# Patient Record
Sex: Male | Born: 1990 | Race: White | Hispanic: No | Marital: Single | Smoking: Current every day smoker
Health system: Southern US, Community
[De-identification: ages and names within clinical notes are randomized; demographics above are authoritative.]

## PROBLEM LIST (undated history)

## (undated) DIAGNOSIS — K589 Irritable bowel syndrome without diarrhea: Secondary | ICD-10-CM

## (undated) HISTORY — PX: WRIST SURGERY: SHX841

---
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
## 2010-08-20 IMAGING — CT CT ABDOMEN W/ CM
1 of 2 series · 15 of 32 positions shown, 20 images · IV contrast (isovue)
Comparison: none

REASON FOR EXAM: splenomegaly  LUQ and LLQ abd pain
COMMENTS:

PROCEDURE:     CT  - CT ABDOMEN STANDARD W  - March 01, 2009  [DATE]
RESULT:
TECHNIQUE: Helical 5 mm sections were obtained from the lung bases through
the pubic symphysis status post intravenous administration of 100 ml Isovue
370 and oral contrast.

[Series 2: abdomen · axial · 0.83mm/px · z∈[-949,-644]mm · 15 of 67 slices shown, 20 images]
[im 3/67  soft-tissue]
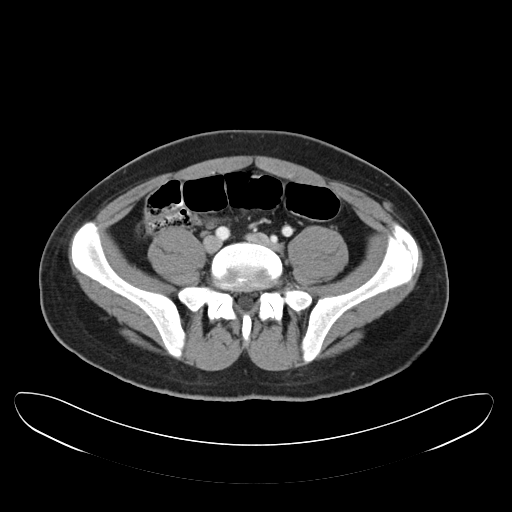
[im 3/67  bone]
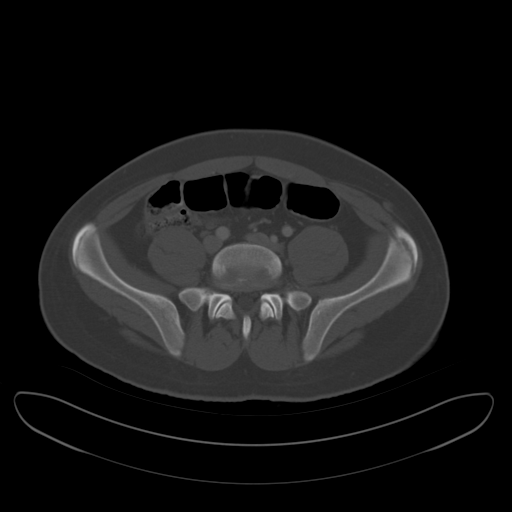
[im 9/67  soft-tissue]
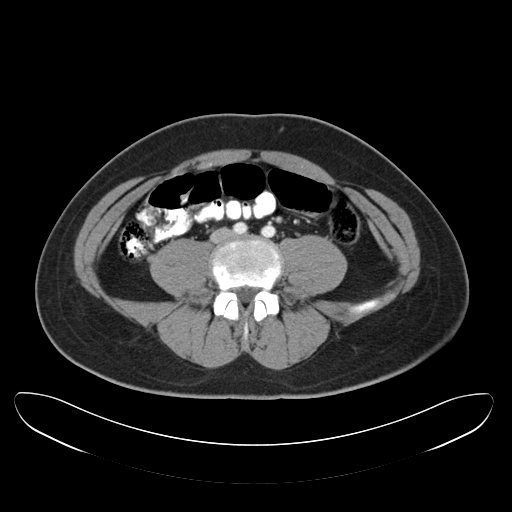
[im 12/67  soft-tissue]
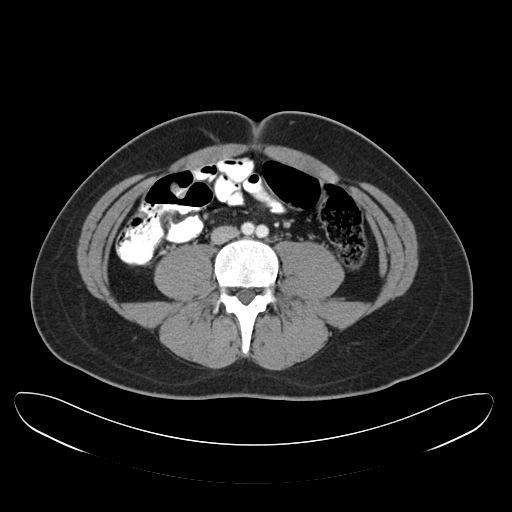
[im 18/67  soft-tissue]
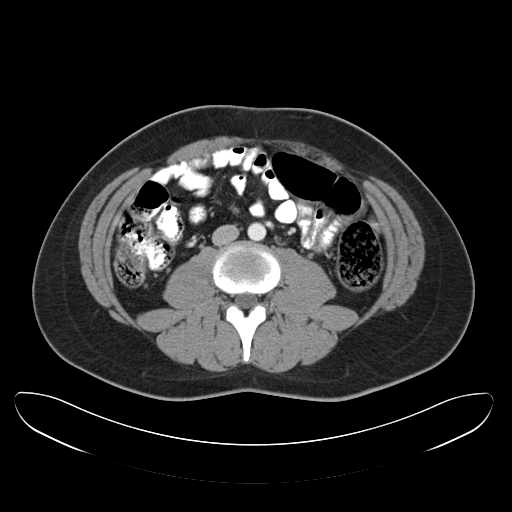
[im 23/67  soft-tissue]
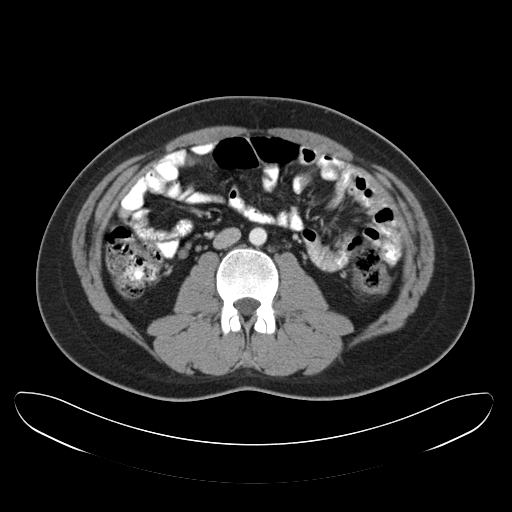
[im 26/67  soft-tissue]
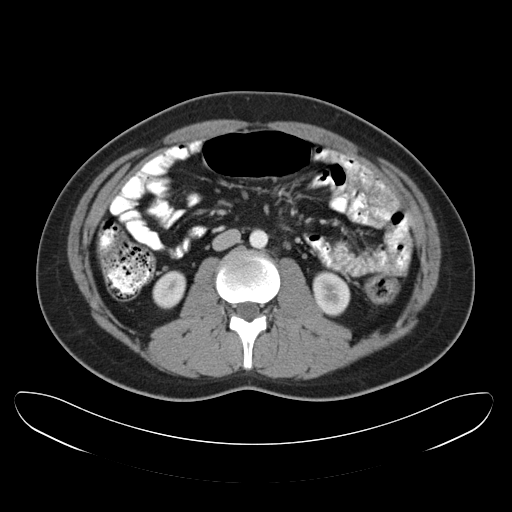
[im 32/67  soft-tissue]
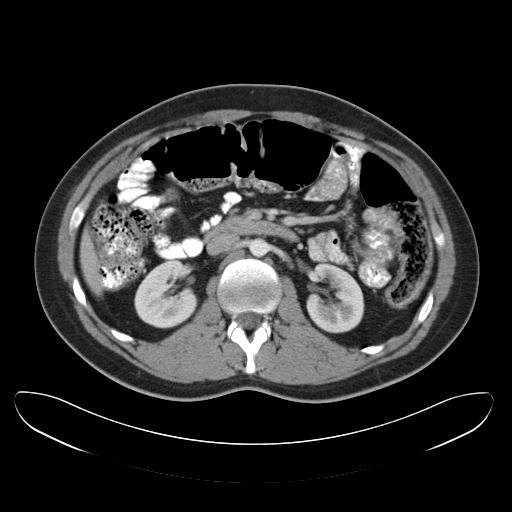
[im 35/67  soft-tissue]
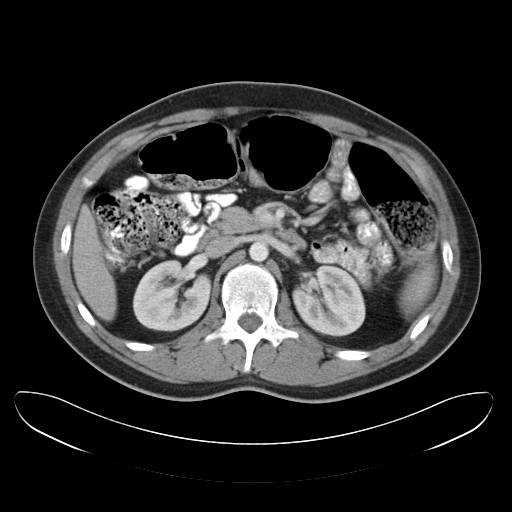
[im 41/67  soft-tissue]
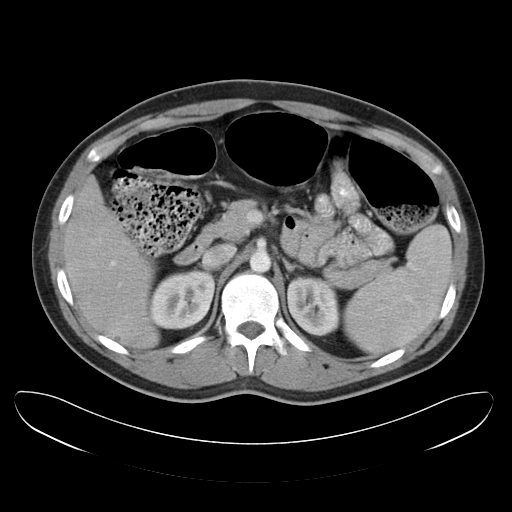
[im 41/67  bone]
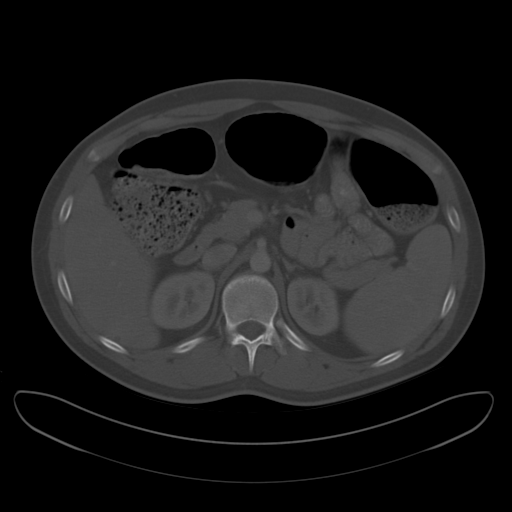
[im 44/67  soft-tissue]
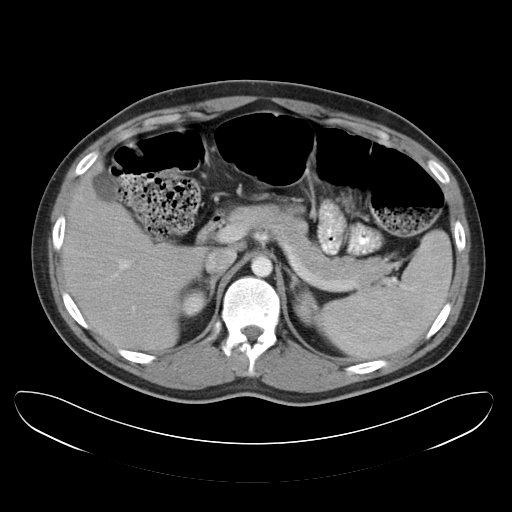
[im 49/67  soft-tissue]
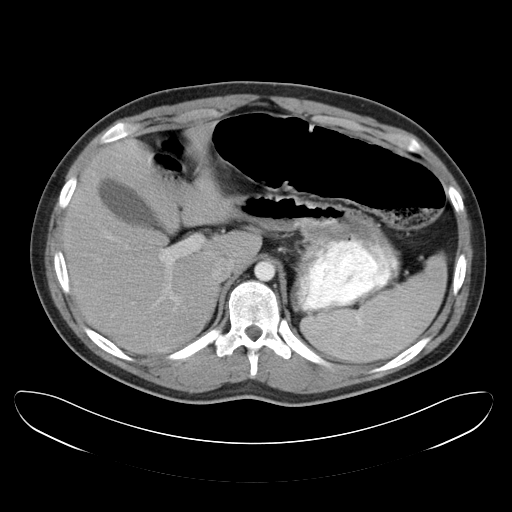
[im 55/67  soft-tissue]
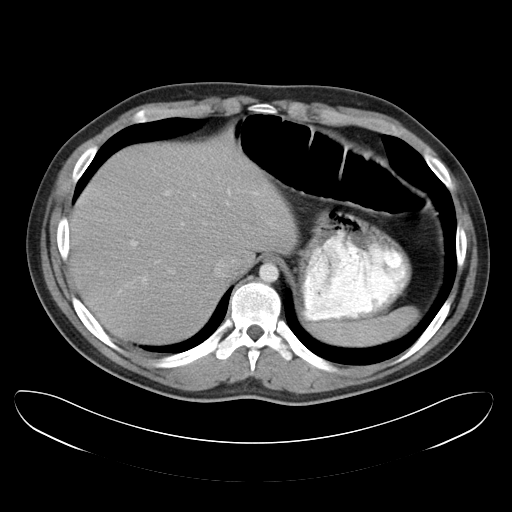
[im 55/67  lung]
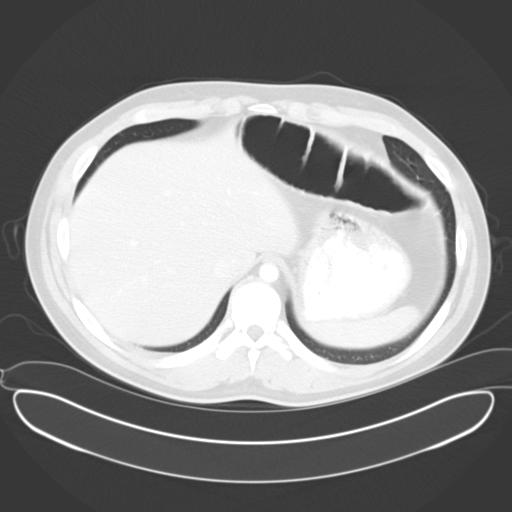
[im 58/67  soft-tissue]
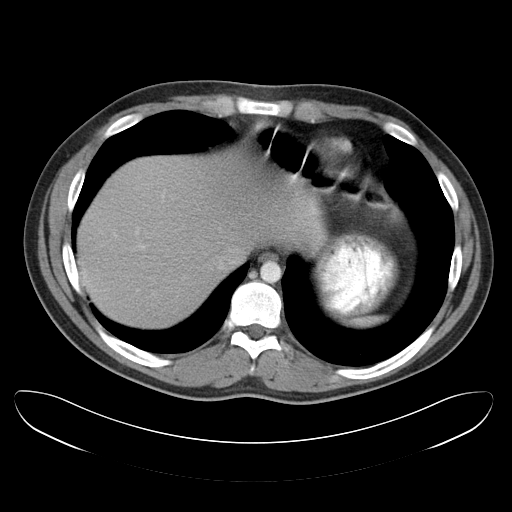
[im 58/67  lung]
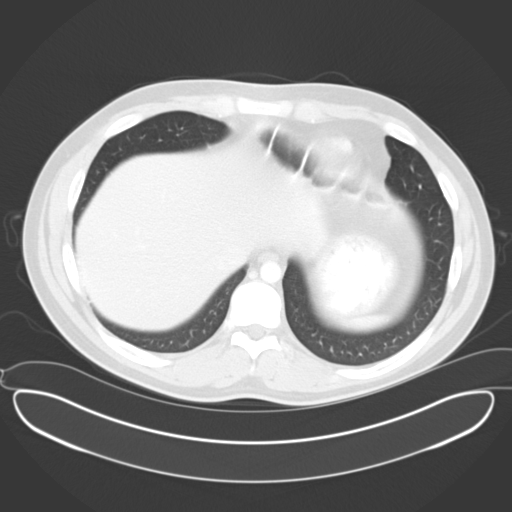
[im 61/67  lung]
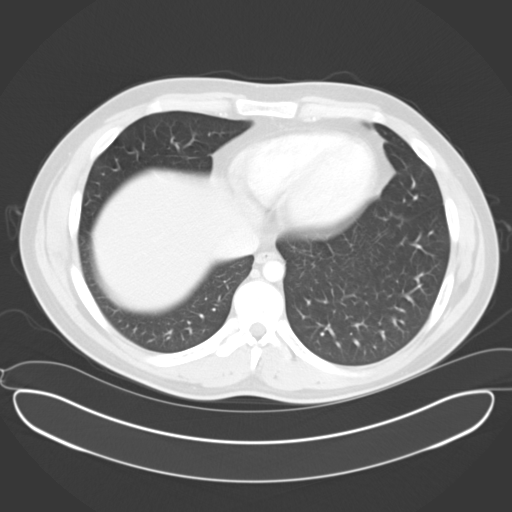
[im 64/67  soft-tissue]
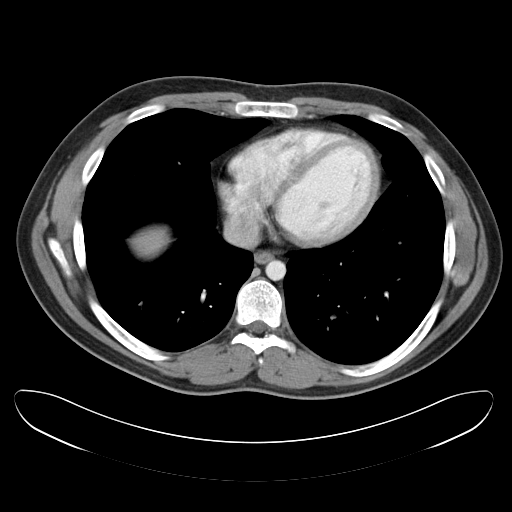
[im 64/67  lung]
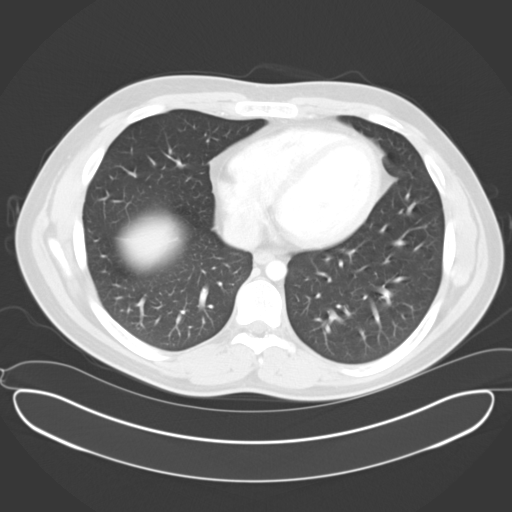

[15 of 32 positions shown; findings below may reference images not displayed]

FINDINGS: Evaluation of the lung bases demonstrates no gross abnormalities.
The liver is unremarkable. The spleen measures 13.55 cm in longitudinal
dimensions. There is no evidence of abdominal masses, free fluid nor
drainable loculated fluid collections. There is no evidence of adenopathy
nor an abdominal aortic aneurysm nor dissection. The celiac, SMA, IMA,
portal vein and SMV are opacified. There is no CT evidence of bowel
obstruction.
IMPRESSION: Mild splenomegaly as described above without further focal
or acute abnormalities.

## 2011-02-17 IMAGING — US ABDOMEN ULTRASOUND LIMITED
1 series · 16 of 16 positions shown · non-contrast
Comparison: none

REASON FOR EXAM: abd pain left side   splenomegaly
COMMENTS:

PROCEDURE:     US  - US ABDOMEN LIMITED SURVEY  - August 29, 2009  [DATE]
RESULT:     A limited ultrasound was performed to evaluate the spleen. The
spleen exhibits relatively homogeneous echotexture and measures 13.8 cm in
greatest dimension.

[Series 1: abdomen ultrasound limited · 16 of 16 slices shown]
[im 1/16]
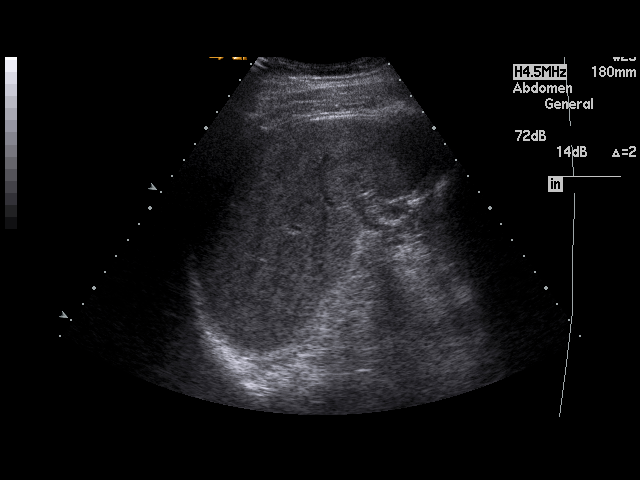
[im 2/16]
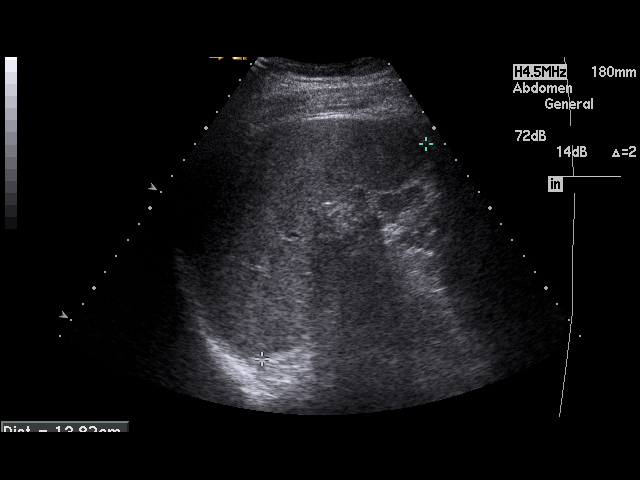
[im 3/16]
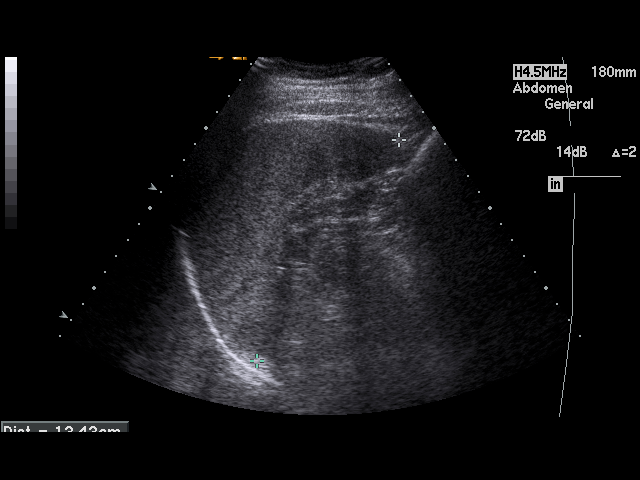
[im 4/16]
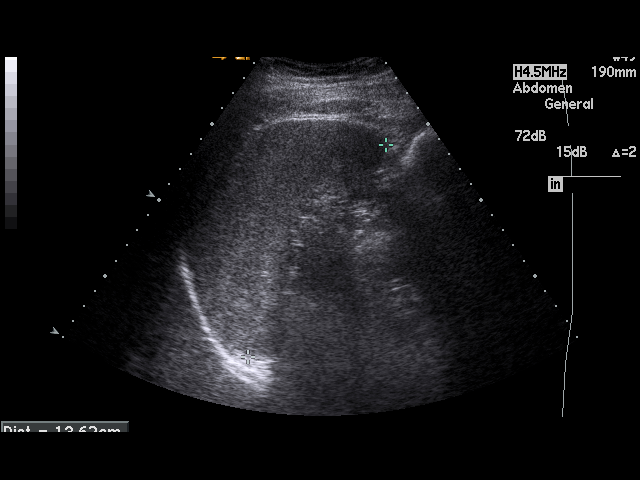
[im 5/16]
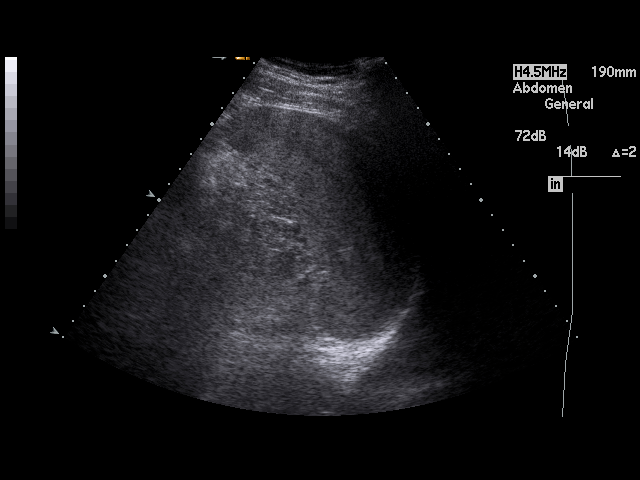
[im 6/16]
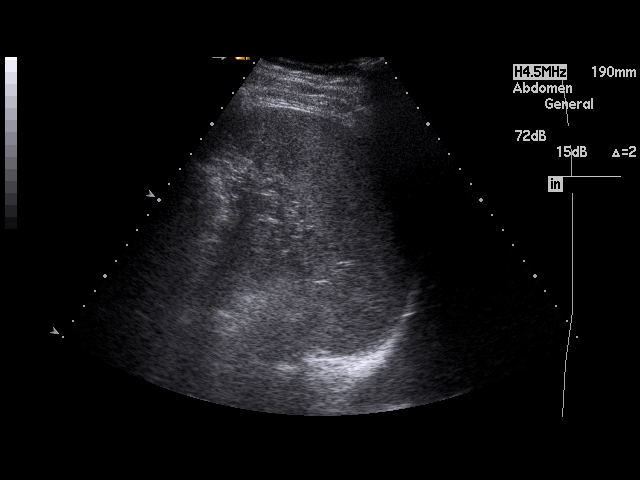
[im 7/16]
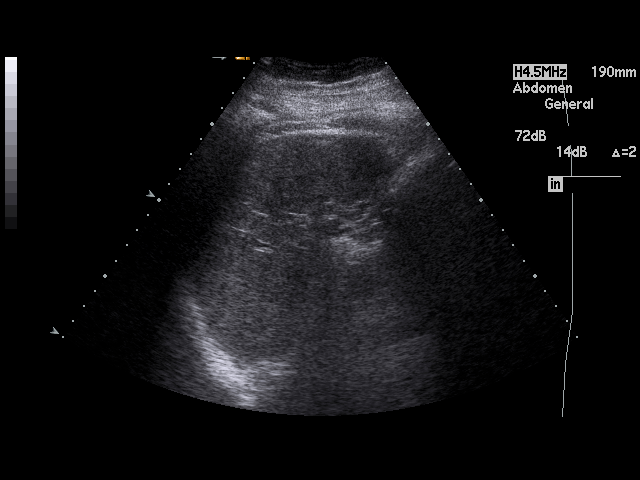
[im 8/16]
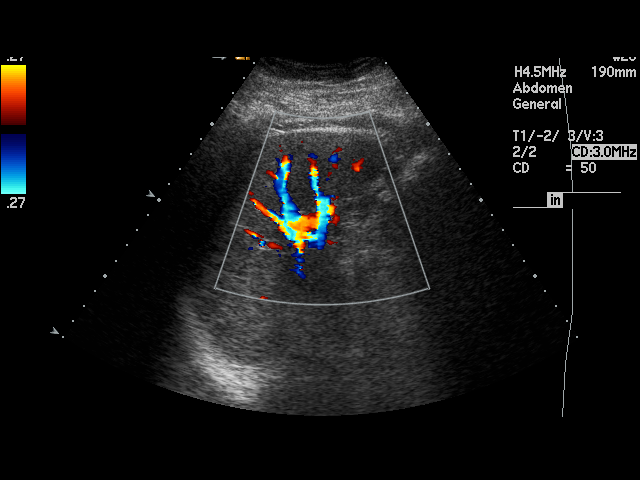
[im 9/16]
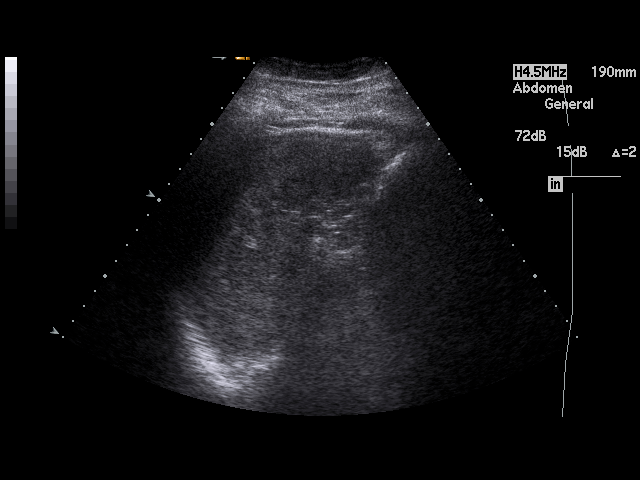
[im 10/16]
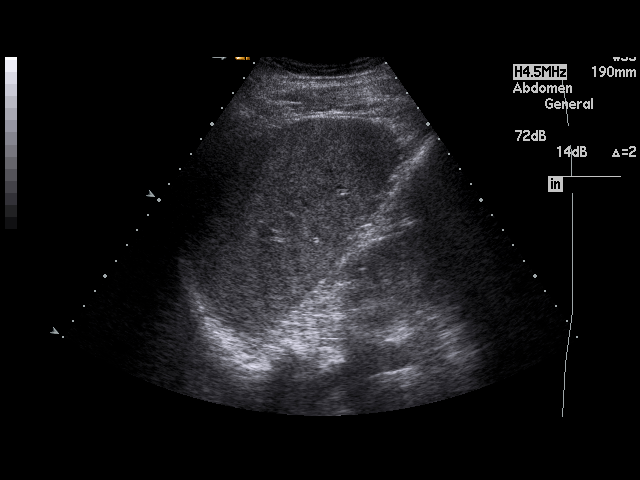
[im 11/16]
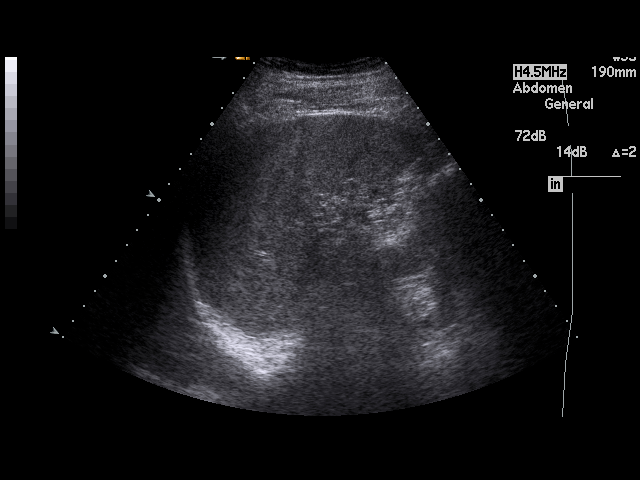
[im 12/16]
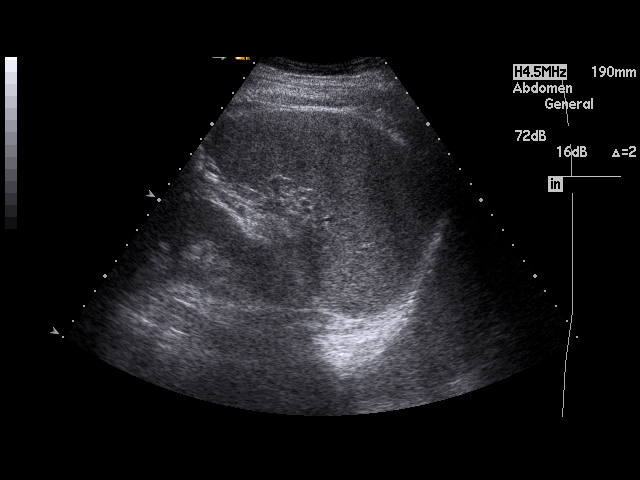
[im 13/16]
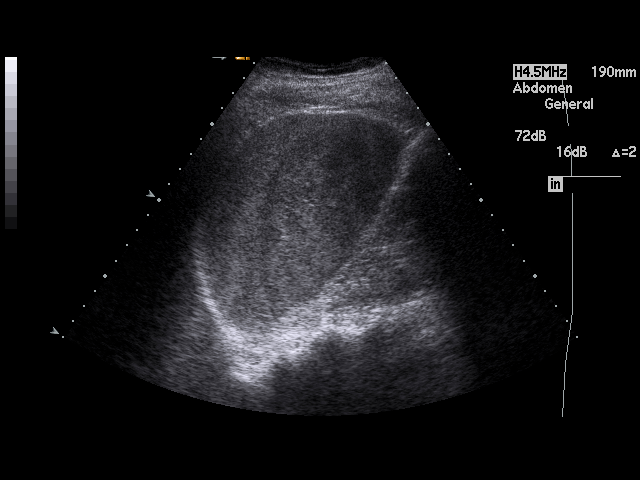
[im 14/16]
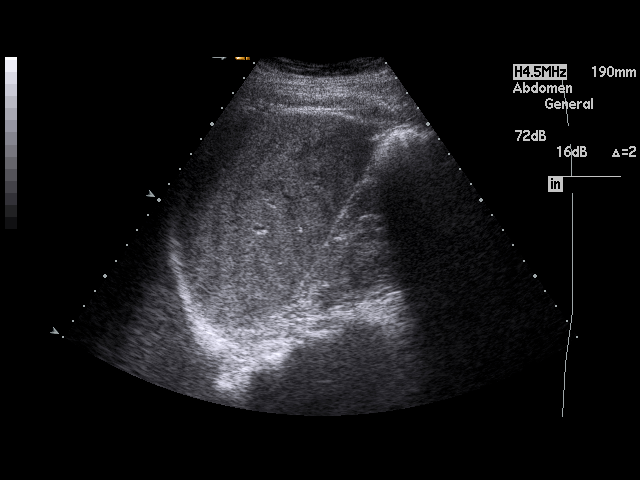
[im 15/16]
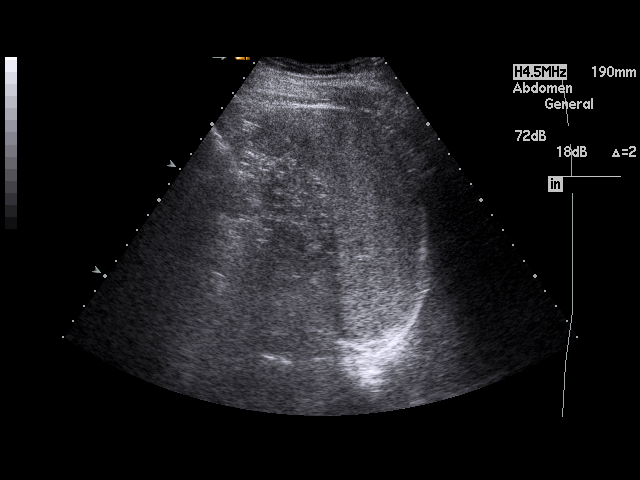
[im 16/16]
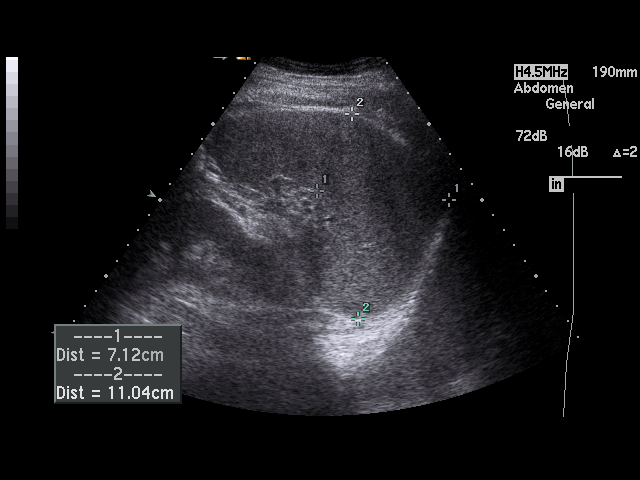

[16 of 16 positions shown; findings below may reference images not displayed]

IMPRESSION: There is mild splenomegaly. I see no focal masses within
the spleen.

## 2012-07-11 ENCOUNTER — Encounter (HOSPITAL_COMMUNITY): Payer: Self-pay | Admitting: *Deleted

## 2012-07-11 ENCOUNTER — Emergency Department (HOSPITAL_COMMUNITY)
Admission: EM | Admit: 2012-07-11 | Discharge: 2012-07-11 | Disposition: A | Discharge status: Home / Self Care | Payer: Self-pay | Attending: Emergency Medicine | Admitting: Emergency Medicine

## 2012-07-11 DIAGNOSIS — M5412 Radiculopathy, cervical region: Secondary | ICD-10-CM

## 2012-07-11 DIAGNOSIS — R209 Unspecified disturbances of skin sensation: Secondary | ICD-10-CM | POA: Insufficient documentation

## 2012-07-11 DIAGNOSIS — M542 Cervicalgia: Secondary | ICD-10-CM

## 2012-07-11 DIAGNOSIS — F17200 Nicotine dependence, unspecified, uncomplicated: Secondary | ICD-10-CM | POA: Insufficient documentation

## 2012-07-11 HISTORY — DX: Irritable bowel syndrome without diarrhea: K58.9

## 2012-07-11 MED ORDER — KETOROLAC TROMETHAMINE 60 MG/2ML IM SOLN
60.0000 mg | Freq: Once | INTRAMUSCULAR | Status: AC
  Administered 2012-07-11: 60 mg via INTRAMUSCULAR
  Filled 2012-07-11: qty 2

## 2012-07-11 MED ORDER — CYCLOBENZAPRINE HCL 10 MG PO TABS
5.0000 mg | ORAL_TABLET | Freq: Once | ORAL | Status: AC
  Administered 2012-07-11: 5 mg via ORAL
  Filled 2012-07-11: qty 1

## 2012-07-11 MED ORDER — CYCLOBENZAPRINE HCL 5 MG PO TABS: 5.0000 mg | ORAL_TABLET | Freq: Two times a day (BID) | ORAL | Status: DC | PRN

## 2012-07-11 MED ORDER — NAPROXEN SODIUM 220 MG PO TABS: 220.0000 mg | ORAL_TABLET | Freq: Two times a day (BID) | ORAL | Status: DC

## 2012-07-11 NOTE — ED Provider Notes (Signed)
History     CSN: [id number]  Arrival date & time 07/11/12  1914   First MD Initiated Contact with Patient 07/11/12 1923      Chief Complaint  Patient presents with  . Neck Pain    (Consider location/radiation/quality/duration/timing/severity/associated sxs/prior treatment) Patient is a 22 y.o. male presenting with neck pain. The history is provided by the patient.  Neck Pain Pain location:  R side Quality:  Stabbing Pain radiates to:  R shoulder Pain severity:  Severe Onset quality:  Gradual Duration:  2 days Timing:  Constant Progression:  Worsening Chronicity:  Recurrent Context: not MVA and not recent injury   Ineffective treatments:  NSAIDs Associated symptoms: tingling   Associated symptoms: no chest pain, no fever, no numbness and no weakness     History reviewed. No pertinent past medical history.  History reviewed. No pertinent past surgical history.  No family history on file.  History  Substance Use Topics  . Smoking status: Current Every Day Smoker  . Smokeless tobacco: Not on file  . Alcohol Use: Yes      Review of Systems  Constitutional: Negative for fever.  HENT: Positive for neck pain. Negative for congestion and facial swelling.   Respiratory: Negative for cough and shortness of breath.   Cardiovascular: Negative for chest pain.  Gastrointestinal: Negative for nausea, vomiting, abdominal pain and diarrhea.  Neurological: Positive for tingling. Negative for weakness and numbness.  All other systems reviewed and are negative.    Allergies  Review of patient's allergies indicates not on file.  Home Medications  No current outpatient prescriptions on file.  BP 147/82  Pulse 95  Temp(Src) 98.4 F (36.9 C) (Oral)  Resp 18  SpO2 100%  Physical Exam  Nursing note and vitals reviewed. Constitutional: He is oriented to person, place, and time. He appears well-developed and well-nourished. No distress.  HENT:  Head: Normocephalic and  atraumatic.  Mouth/Throat: Oropharynx is clear and moist.  Eyes: Conjunctivae are normal. Pupils are equal, round, and reactive to light. No scleral icterus.  Neck: Normal range of motion and full passive range of motion without pain. Neck supple. Muscular tenderness present. No spinous process tenderness present. Normal range of motion present.  Pain on right side of neck with range of motion, particularly rotation left.   Positive spurling sign.    Cardiovascular: Normal rate, regular rhythm, normal heart sounds and intact distal pulses.   No murmur heard. Pulmonary/Chest: Effort normal and breath sounds normal. No stridor. No respiratory distress. He has no wheezes. He has no rales.  Abdominal: Soft. He exhibits no distension. There is no tenderness.  Musculoskeletal: Normal range of motion. He exhibits no edema.  Neurological: He is alert and oriented to person, place, and time.  Skin: Skin is warm and dry. No rash noted.  Psychiatric: He has a normal mood and affect. His behavior is normal.    ED Course  Procedures (including critical care time)  Labs Reviewed - No data to display No results found.   1. Cervical radiculopathy   2. Neck pain       MDM   22 yo male w right sided neck pain. No trauma, no fevers. Appears musculoskeletal.  Also has radicular symptoms. No neuro deficits.  IM toradol + PO flexeril for pain.  8:28 PM feels better after meds.      Rennis Petty, MD 07/11/12 2032

## 2012-07-11 NOTE — ED Notes (Signed)
The pt has had neck pain for 2 years intermittently.  No recent injury.  No temp

## 2012-07-12 NOTE — ED Provider Notes (Signed)
I directly supervised Dr. Loretha Stapler, reviewed the resident's note and I agree with the findings and plan.   Vida Roller, MD 07/12/12 1254

## 2016-02-06 ENCOUNTER — Inpatient Hospital Stay
Admission: RE | Admit: 2016-02-06 | Discharge: 2016-02-07 | DRG: 885 | Disposition: A | Discharge status: Home / Self Care | Payer: No Typology Code available for payment source | Source: Intra-hospital | Attending: Psychiatry | Admitting: Psychiatry

## 2016-02-06 ENCOUNTER — Encounter (HOSPITAL_COMMUNITY): Payer: Self-pay

## 2016-02-06 ENCOUNTER — Emergency Department (HOSPITAL_COMMUNITY)
Admission: EM | Admit: 2016-02-06 | Discharge: 2016-02-06 | Payer: Self-pay | Attending: Emergency Medicine | Admitting: Emergency Medicine

## 2016-02-06 DIAGNOSIS — F1220 Cannabis dependence, uncomplicated: Secondary | ICD-10-CM | POA: Diagnosis present

## 2016-02-06 DIAGNOSIS — F1994 Other psychoactive substance use, unspecified with psychoactive substance-induced mood disorder: Secondary | ICD-10-CM | POA: Diagnosis present

## 2016-02-06 DIAGNOSIS — T450X2A Poisoning by antiallergic and antiemetic drugs, intentional self-harm, initial encounter: Secondary | ICD-10-CM | POA: Diagnosis present

## 2016-02-06 DIAGNOSIS — G4700 Insomnia, unspecified: Secondary | ICD-10-CM | POA: Diagnosis present

## 2016-02-06 DIAGNOSIS — Z79899 Other long term (current) drug therapy: Secondary | ICD-10-CM | POA: Insufficient documentation

## 2016-02-06 DIAGNOSIS — F17210 Nicotine dependence, cigarettes, uncomplicated: Secondary | ICD-10-CM | POA: Diagnosis present

## 2016-02-06 DIAGNOSIS — K589 Irritable bowel syndrome without diarrhea: Secondary | ICD-10-CM | POA: Diagnosis present

## 2016-02-06 DIAGNOSIS — F1020 Alcohol dependence, uncomplicated: Secondary | ICD-10-CM | POA: Diagnosis present

## 2016-02-06 DIAGNOSIS — F909 Attention-deficit hyperactivity disorder, unspecified type: Secondary | ICD-10-CM | POA: Diagnosis present

## 2016-02-06 DIAGNOSIS — F332 Major depressive disorder, recurrent severe without psychotic features: Secondary | ICD-10-CM | POA: Diagnosis present

## 2016-02-06 DIAGNOSIS — R45851 Suicidal ideations: Secondary | ICD-10-CM

## 2016-02-06 DIAGNOSIS — Y900 Blood alcohol level of less than 20 mg/100 ml: Secondary | ICD-10-CM | POA: Diagnosis present

## 2016-02-06 DIAGNOSIS — Z884 Allergy status to anesthetic agent status: Secondary | ICD-10-CM | POA: Diagnosis not present

## 2016-02-06 DIAGNOSIS — F17200 Nicotine dependence, unspecified, uncomplicated: Secondary | ICD-10-CM

## 2016-02-06 DIAGNOSIS — F4010 Social phobia, unspecified: Secondary | ICD-10-CM | POA: Diagnosis present

## 2016-02-06 DIAGNOSIS — T50901A Poisoning by unspecified drugs, medicaments and biological substances, accidental (unintentional), initial encounter: Secondary | ICD-10-CM | POA: Diagnosis present

## 2016-02-06 LAB — CBC
HEMATOCRIT: 46.7 % (ref 39.0–52.0)
HEMOGLOBIN: 17 g/dL (ref 13.0–17.0)
MCH: 32.8 pg (ref 26.0–34.0)
MCHC: 36.4 g/dL — ABNORMAL HIGH (ref 30.0–36.0)
MCV: 90 fL (ref 78.0–100.0)
PLATELETS: 212 10*3/uL (ref 150–400)
RBC: 5.19 MIL/uL (ref 4.22–5.81)
RDW: 13 % (ref 11.5–15.5)
WBC: 7.5 10*3/uL (ref 4.0–10.5)

## 2016-02-06 LAB — COMPREHENSIVE METABOLIC PANEL
ALBUMIN: 4.9 g/dL (ref 3.5–5.0)
ALT: 17 U/L (ref 17–63)
AST: 20 U/L (ref 15–41)
Alkaline Phosphatase: 47 U/L (ref 38–126)
Anion gap: 9 (ref 5–15)
BUN: 11 mg/dL (ref 6–20)
CHLORIDE: 108 mmol/L (ref 101–111)
CO2: 25 mmol/L (ref 22–32)
Calcium: 9.3 mg/dL (ref 8.9–10.3)
Creatinine, Ser: 0.85 mg/dL (ref 0.61–1.24)
GFR calc Af Amer: >60 mL/min (ref >60)
GFR calc non Af Amer: >60 mL/min (ref >60)
GLUCOSE: 87 mg/dL (ref 65–99)
POTASSIUM: 3.2 mmol/L — AB (ref 3.5–5.1)
Sodium: 142 mmol/L (ref 135–145)
Total Bilirubin: 0.6 mg/dL (ref 0.3–1.2)
Total Protein: 8 g/dL (ref 6.5–8.1)

## 2016-02-06 LAB — RAPID URINE DRUG SCREEN, HOSP PERFORMED
AMPHETAMINES: NOT DETECTED
BARBITURATES: NOT DETECTED
BENZODIAZEPINES: NOT DETECTED
Cocaine: NOT DETECTED
Opiates: NOT DETECTED
Tetrahydrocannabinol: POSITIVE — AB

## 2016-02-06 LAB — ACETAMINOPHEN LEVEL: Acetaminophen (Tylenol), Serum: <10 ug/mL — ABNORMAL LOW (ref 10–30)

## 2016-02-06 LAB — ETHANOL: Alcohol, Ethyl (B): 16 mg/dL — ABNORMAL HIGH (ref <5)

## 2016-02-06 LAB — SALICYLATE LEVEL: Salicylate Lvl: <7 mg/dL (ref 2.8–30.0)

## 2016-02-06 MED ORDER — MAGNESIUM HYDROXIDE 400 MG/5ML PO SUSP: 30.0000 mL | Freq: Every day | ORAL | Status: DC | PRN

## 2016-02-06 MED ORDER — LORAZEPAM 1 MG PO TABS: 0.0000 mg | ORAL_TABLET | Freq: Two times a day (BID) | ORAL | Status: DC

## 2016-02-06 MED ORDER — INFLUENZA VAC SPLIT QUAD 0.5 ML IM SUSY: 0.5000 mL | PREFILLED_SYRINGE | INTRAMUSCULAR | Status: DC

## 2016-02-06 MED ORDER — PNEUMOCOCCAL VAC POLYVALENT 25 MCG/0.5ML IJ INJ: 0.5000 mL | INJECTION | INTRAMUSCULAR | Status: DC

## 2016-02-06 MED ORDER — LORAZEPAM 1 MG PO TABS: 0.0000 mg | ORAL_TABLET | Freq: Four times a day (QID) | ORAL | Status: DC

## 2016-02-06 MED ORDER — IBUPROFEN 400 MG PO TABS: 800.0000 mg | ORAL_TABLET | Freq: Four times a day (QID) | ORAL | Status: DC | PRN

## 2016-02-06 MED ORDER — POTASSIUM CHLORIDE CRYS ER 20 MEQ PO TBCR
40.0000 meq | EXTENDED_RELEASE_TABLET | Freq: Once | ORAL | Status: AC
  Administered 2016-02-06: 40 meq via ORAL
  Filled 2016-02-06: qty 2

## 2016-02-06 MED ORDER — ACETAMINOPHEN 325 MG PO TABS: 650.0000 mg | ORAL_TABLET | Freq: Four times a day (QID) | ORAL | Status: DC | PRN

## 2016-02-06 MED ORDER — CHLORDIAZEPOXIDE HCL 25 MG PO CAPS
25.0000 mg | ORAL_CAPSULE | Freq: Four times a day (QID) | ORAL | Status: DC
  Administered 2016-02-06 – 2016-02-07 (×2): 25 mg via ORAL
  Filled 2016-02-06 (×2): qty 1

## 2016-02-06 MED ORDER — NICOTINE 21 MG/24HR TD PT24: 21.0000 mg | MEDICATED_PATCH | Freq: Every day | TRANSDERMAL | Status: DC

## 2016-02-06 MED ORDER — TRAZODONE HCL 50 MG PO TABS
50.0000 mg | ORAL_TABLET | Freq: Every evening | ORAL | Status: DC | PRN
  Administered 2016-02-06: 50 mg via ORAL
  Filled 2016-02-06: qty 1

## 2016-02-06 MED ORDER — ALUM & MAG HYDROXIDE-SIMETH 200-200-20 MG/5ML PO SUSP: 30.0000 mL | ORAL | Status: DC | PRN

## 2016-02-06 NOTE — BH Assessment (Signed)
[facility]  Per Alberteen SamFran Hobson, NP, this pt requires psychiatric hospitalization.  Robinette HainesCalvin Manning calls to report that pt has been accepted to [facility] by Dr Ardyth HarpsHernandez to the service of Dr Jennet MaduroPucilowska, Rm 314.  He asks me to have pt sign consent forms, and to fax them to the attention of Harriett Sineancy at [phone].  Drenda FreezeFran concurs with decision to transfer.  Pt has signed Voluntary Admission and Consent for Treatment, as well as Consent to Release Information to no one, and signed [facility].  Pt's nurse, Kendal Hymendie, has been notified, and agrees to call report to [phone].  Pt is to be transported, along with original forms, via [facility].   Wenda Vanschaick, MA Triage Specialist [phone]

## 2016-02-06 NOTE — BH Assessment (Addendum)
Patient has been accepted to [facility].  Accepting physician is Dr. Ardyth HarpsHernandez.  Attending Physician will be Dr. Jennet MaduroPucilowska.  Patient has been assigned to room 314, by [facility] Charge Nurse Gigi.  Call report to [phone].  Representative/Transfer Coordinator is Nicola Girtalvin  [facility] ER Staff Maisie Fus(Thomas H., TTS) made aware of acceptance. Patient pre-admit by Patient Access Angelique Blonder(Denise).

## 2016-02-06 NOTE — ED Provider Notes (Signed)
[facility]-EMER[facility]ENCY DEPT Provider Note   CSN: [id number] Arrival date & time: 02/06/16  [id number]     History   Chief Complaint Chief Complaint  Patient presents with  . Suicidal    HPI Dakota Levy is a 25 y.o. male.  The history is provided by the patient and medical records. No language interpreter was used.    Dakota Levy is a 25 y.o. male  presents to emergency department by [employer] for suicidal ideations and medical clearance. Patient states that he had the intention to take all of his sleeping pills last night in an attempted to kill himself. He drank an 18 pack of beer from 9pm-4am and passed out prior to an attempt. Per nursing report, he posted on facebook that he was going to take a bunch of sleeping pills and was going to die. Today, he endorses a depressed mood and still thinking about harming himself. Denies homicidal ideations. No auditory or visual hallucinations. Endorses intermittent marijuana use and daily ETOH use.   Past Medical History:  Diagnosis Date  . IBS (irritable bowel syndrome)     There are no active problems to display for this patient.   History reviewed. No pertinent surgical history.     Home Medications    Prior to Admission medications   Medication Sig Start Date End Date Taking? Authorizing Provider  ibuprofen (ADVIL,MOTRIN) 200 MG tablet Take 800 mg by mouth every 6 (six) hours as needed for pain.   Yes Historical Provider, MD    Family History No family history on file.  Social History Social History  Substance Use Topics  . Smoking status: Current Every Day Smoker  . Smokeless tobacco: Not on file  . Alcohol use Yes     Allergies   Ketamine hcl   Review of Systems Review of Systems  Constitutional: Negative for fever.  HENT: Negative for congestion.   Eyes: Negative for visual disturbance.  Respiratory: Negative for cough and shortness of breath.   Cardiovascular: Negative.   Gastrointestinal: Negative for abdominal  pain, nausea and vomiting.  Musculoskeletal: Negative for back pain.  Skin: Negative for rash.  Neurological: Negative for headaches.  Psychiatric/Behavioral: Positive for suicidal ideas.     Physical Exam Updated Vital Signs BP 130/89 (BP Location: Left Arm)   Pulse 89   Temp 98.2 F (36.8 C) (Oral)   Resp 16   Ht 6\' 3"  (1.905 m)   Wt 77.1 kg   SpO2 100%   BMI 21.25 kg/m   Physical Exam  Constitutional: He is oriented to person, place, and time. He appears well-developed and well-nourished. No distress.  HENT:  Head: Normocephalic and atraumatic.  Cardiovascular: Normal rate, regular rhythm and normal heart sounds.   Pulmonary/Chest: Effort normal and breath sounds normal. No respiratory distress.  Abdominal: Soft. He exhibits no distension. There is no tenderness.  Musculoskeletal: Normal range of motion.  Neurological: He is alert and oriented to person, place, and time.  Skin: Skin is warm and dry.  Nursing note and vitals reviewed.    ED Treatments / Results  Labs (all labs ordered are listed, but only abnormal results are displayed) Labs Reviewed  COMPREHENSIVE METABOLIC PANEL - Abnormal; Notable for the following:       Result Value   Potassium 3.2 (*)    All other components within normal limits  ETHANOL - Abnormal; Notable for the following:    Alcohol, Ethyl (B) 16 (*)    All other components within normal limits  ACETAMINOPHEN LEVEL - Abnormal; Notable for the following:    Acetaminophen (Tylenol), Serum <10 (*)    All other components within normal limits  CBC - Abnormal; Notable for the following:    MCHC 36.4 (*)    All other components within normal limits  RAPID URINE DRUG SCREEN, HOSP PERFORMED - Abnormal; Notable for the following:    Tetrahydrocannabinol POSITIVE (*)    All other components within normal limits  SALICYLATE LEVEL    EKG  EKG Interpretation None       Radiology No results found.  Procedures Procedures (including  critical care time)  Medications Ordered in ED Medications  potassium chloride SA (K-DUR,KLOR-CON) CR tablet 40 mEq (not administered)     Initial Impression / Assessment and Plan / ED Course  I have reviewed the triage vital signs and the nursing notes.  Pertinent labs & imaging results that were available during my care of the patient were reviewed by me and considered in my medical decision making (see chart for details).  Clinical Course   Dakota Levy is a 25 y.o. male who presents to ED for medical clearance and suicidal ideations. Labs reviewed. K+ of 3.2 and replenished while in ED. ETOH of 16. CIWA in place given hx of ETOH but no withdrawal symptoms at present. Medically cleared and disposition pending TTS.    Final Clinical Impressions(s) / ED Diagnoses   Final diagnoses:  None    New Prescriptions New Prescriptions   No medications on file     [facility] HamiltonJaime Pilcher Ward, PA-C 02/06/16 1145    Loren Raceravid Yelverton, MD 02/09/16 [phone]

## 2016-02-06 NOTE — ED Notes (Signed)
Pt wanded by security. 

## 2016-02-06 NOTE — ED Notes (Signed)
Pt oriented to room and unit.  Pt is calm and cooperative.  He is somewhat withdrawn and denies S/I and H/I at this time.  He also denies AVH.  15 minute checks and video monitoring in place.

## 2016-02-06 NOTE — ED Triage Notes (Addendum)
[facility]. Per the sheriff, pt posted on facebook around 4 this morning that he was going to take a bunch of sleeping pills and that he was going to die. Pt reported to the sheriff that he does not want to hurt her or himself but he was feeling suicidal last night. Pt also has an alcohol problem, last drink was last night, roughly an 18 pack. Pt reports that he is still feeling suicidal today with a plan to take pills.

## 2016-02-06 NOTE — ED Notes (Signed)
Bed: [facility] Expected date:  Expected time:  Means of arrival:  Comments: 

## 2016-02-06 NOTE — BH Assessment (Addendum)
Assessment Note  Dakota Levy is an 25 y.o. male. [facility]. Patient sts that he woke up this morning with [employer] banging on his door. Sts that [employer] told him that multiple people called out of concern for his safety.   Patient reportedly posted on FaceBook that he was suicidal and wanted to take his life. Patient admits during the TTS assessment that this happened. His suicidal thoughts were triggered by his friend telling him, "I'm done with you"! Sts that his parents told him, "You are so stupid and you might as well kill yourself". Patient sts, "Well if my parents want me dead and my friend doesn't want anything to do with me I may as well commit suicide". He had a plan to overdose on sleeping pills last night. Unfortunately, patient was unable to follow through with his suicide plan because he fell asleep. He admits that he had access to mean (sleeping pills). No history of prior suicide attempts/gestures. He has a history of self mutilating behaviors such as burning and cutting. Last self mutilating incident was 1 week ago and patient burned his arm. Depressive symptoms have increased evidenced by crying spells, isolating self from others, crying spells, and poor sleep. Appetite is fair. Patient has daily panic attacks. Last panic attack was today. Patient reports 10 pounds of weight gain in the past month. He has a family history of Bipolar Disorder, anxiety, OCD, and depression (mother and maternal grandmother).   Patient denies HI and AVH's. No history of assaultive or aggressive behaviors. No legal issues. Currently calmand cooperative; very polite. Patient has a history of polysubstance abuse. SEE ADDITIONAL SOCIAL HISTORY section for details related to substance use. No history of seizures. He does have a history of black outs.   No history of INPT psychiatric hospitalizations. No outpatient therapist or psychiatrist.   Diagnosis: Major Depressive Disorder,  Recurrent, Severe, without psychotic features; Anxiety Disorder; Polysubstance abuse  Past Medical History:  Past Medical History:  Diagnosis Date  . IBS (irritable bowel syndrome)     History reviewed. No pertinent surgical history.  Family History: No family history on file.  Social History:  reports that he has been smoking.  He does not have any smokeless tobacco history on file. He reports that he drinks alcohol. His drug history is not on file.  Additional Social History:  Alcohol / Drug Use Pain Medications: SEE MAR Prescriptions: SEE MAR Over the Counter: SEE MAR History of alcohol / drug use?: Yes Longest period of sobriety (when/how long): 2 days  Negative Consequences of Use: Financial Substance #1 Name of Substance 1: Alcohol  1 - Age of First Use: 25 yrs old  1 - Amount (size/oz): binging; drinks both liqour and beer 1 - Frequency: "I hadn't drank for yrs up until the past 3 months...now I drink every day"; daily use of alcohol  1 - Duration: 3 months  1 - Last Use / Amount: last night; "I drank more than a 12 pack but that's all I can remember" Substance #2 Name of Substance 2: THC 2 - Age of First Use: 25 yrs old  2 - Amount (size/oz): "Alot", 2-3 grams or more, "It's a all day thing" 2 - Frequency: daily since the age of 25 2 - Duration: 7 yrs  2 - Last Use / Amount: last night Substance #3 Name of Substance 3: Cocaine 3 - Age of First Use: 25 yrs old  3 - Amount (size/oz): 1 line  3 - Frequency: "Rarely"; "I have used 2x's in my lifetime" 3 - Duration: "Rarely" 3 - Last Use / Amount: 2 weeks ago  Substance #4 Name of Substance 4: LSD 4 - Age of First Use: 25 yrs old  4 - Amount (size/oz): 2 hits 4 - Frequency: "Not very often" 4 - Duration: "I've tried it 3x's total in the past year" 4 - Last Use / Amount: 1 month ago Substance #5 Name of Substance 5: Opiates (Hydrocodone) 5 - Age of First Use: 25 yrs old  5 - Amount (size/oz): 3-4 pills 5 -  Frequency: "not very often" 5 - Duration: on-going  5 - Last Use / Amount: "I can't remember the last time I tooks pills...maybe 1 yr to 2 yrs ago"  CIWA: CIWA-Ar BP: 130/89 Pulse Rate: 89 COWS:    Allergies:  Allergies  Allergen Reactions  . Ketamine Hcl Other (See Comments)    Hallucinations.    Home Medications:  (Not in a hospital admission)  OB/GYN Status:  No LMP for male patient.  General Assessment Data Location of Assessment: [facility] TTS Assessment: In system Is this a Tele or Face-to-Face Assessment?: Face-to-Face Is this an Initial Assessment or a Re-assessment for this encounter?: Re-Assessment Marital status: Single Maiden name:  (n/a) Is patient pregnant?: No Pregnancy Status: No Living Arrangements: Parent Can pt return to current living arrangement?: Yes Admission Status: Voluntary Is patient capable of signing voluntary admission?: No Referral Source: Self/Family/Friend Insurance type:  (Self Pay )  Medical Screening Exam [facility] Walk-in ONLY) Medical Exam completed:  (n/a)  Crisis Care Plan Living Arrangements: Parent Legal Guardian:  (no legal guardian ) Name of Psychiatrist:  (no pscyhiatrist ) Name of Therapist:  (no therapist )  Education Status Is patient currently in school?: No Current Grade:  (n/a) Highest grade of school patient has completed:  (some college ) Name of school:  (n/a) Contact person:  (n/a)  Risk to self with the past 6 months Suicidal Ideation: Yes-Currently Present Has patient been a risk to self within the past 6 months prior to admission? : Yes Suicidal Intent: Yes-Currently Present Has patient had any suicidal intent within the past 6 months prior to admission? : Yes Is patient at risk for suicide?: Yes Suicidal Plan?: Yes-Currently Present Has patient had any suicidal plan within the past 6 months prior to admission? : Yes Specify Current Suicidal Plan:  (Last night planned to OD on sleeping pills but passed out  ) Access to Means: Yes Specify Access to Suicidal Means:  (access to sleeping pills and etoh to overdose) What has been your use of drugs/alcohol within the last 12 months?:  (alcohol ) Previous Attempts/Gestures: No How many times?:  (0; "I never tried...just thoughts") Other Self Harm Risks:  (cutting and burning in the past; last time was 1 wk ago ) Triggers for Past Attempts:  (ideations only; no past attempts or gestures ) Intentional Self Injurious Behavior: Cutting, Burning (burned self 1 week ago) Comment - Self Injurious Behavior:  (hx of cutting and burning; burned self 1 week ago..rt. arm ) Family Suicide History: Yes (maternal grandmother-Bipolar, Manic; mother-anxiety and OCD) Recent stressful life event(s): Other (Comment), Conflict (Comment) ("The only friend I have told me to fuck off"; "depression") Persecutory voices/beliefs?: No Depression: Yes Depression Symptoms: Feeling angry/irritable, Feeling worthless/self pity, Loss of interest in usual pleasures, Guilt, Fatigue, Tearfulness Substance abuse history and/or treatment for substance abuse?: No Suicide prevention information given to non-admitted patients: Not applicable  Risk to Others within the past 6 months Homicidal Ideation: No Does patient have any lifetime risk of violence toward others beyond the six months prior to admission? : No Thoughts of Harm to Others: No Current Homicidal Intent: No Current Homicidal Plan: No Access to Homicidal Means: No Identified Victim:  (n/a) History of harm to others?: No Assessment of Violence: None Noted Violent Behavior Description:  (currently calm and cooperative; no hx of violent or aggressi) Does patient have access to weapons?: No Criminal Charges Pending?: No Does patient have a court date: No Is patient on probation?: No  Psychosis Hallucinations: None noted Delusions: None noted  Mental Status Report Appearance/Hygiene: In scrubs Eye Contact: Good Motor  Activity: Freedom of movement Speech: Logical/coherent Level of Consciousness: Alert Mood: Depressed Affect: Sad Anxiety Level: Panic Attacks Panic attack frequency:  (every day .Marland Kitchen.Marland Kitchen.esp when driving ) Most recent panic attack:  ("last night") Thought Processes: Relevant, Coherent Judgement: Impaired Orientation: Time, Situation, Person, Place Obsessive Compulsive Thoughts/Behaviors: None  Cognitive Functioning Concentration: Decreased Memory: Recent Intact, Remote Intact IQ: Average Insight: Poor Impulse Control: Poor Appetite: Fair Weight Loss:  (n/a) Weight Gain:  (1 month...10 pounds ) Sleep: Decreased Total Hours of Sleep:  (4 to 5 hrs per night; "I can sleep with THC only") Vegetative Symptoms: Staying in bed  ADLScreening [facility] Assessment Services) Patient's cognitive ability adequate to safely complete daily activities?: Yes Patient able to express need for assistance with ADLs?: Yes Independently performs ADLs?: Yes (appropriate for developmental age)  Prior Inpatient Therapy Prior Inpatient Therapy: No Prior Therapy Dates:  (n/a) Prior Therapy Facilty/Provider(s):  (n/a) Reason for Treatment:  (n/a)  Prior Outpatient Therapy Prior Outpatient Therapy: No Prior Therapy Dates:  (n/a) Prior Therapy Facilty/Provider(s):  (n/a) Reason for Treatment:  (n/a) Does patient have an ACCT team?: No Does patient have Intensive In-House Services?  : No Does patient have [facility] services? : No Does patient have P4CC services?: No  ADL Screening (condition at time of admission) Patient's cognitive ability adequate to safely complete daily activities?: Yes Patient able to express need for assistance with ADLs?: Yes Independently performs ADLs?: Yes (appropriate for developmental age)             Advance Directives (For Healthcare) Does patient have an advance directive?: No    Additional Information 1:1 In Past 12 Months?: No CIRT Risk: No Elopement Risk:  No Does patient have medical clearance?: Yes     Disposition:  Disposition Initial Assessment Completed for this Encounter: Yes  On Site Evaluation by:   Reviewed with Physician:    Octaviano BattyPerry, Virgilene Stryker Mona 02/06/2016 12:16 PM

## 2016-02-06 NOTE — ED Notes (Signed)
Pt discharged ambulatory with [facility] driver.  Pt was in no distress.  All belongings were sent with pt. 

## 2016-02-06 NOTE — Tx Team (Signed)
Initial Treatment Plan 02/06/2016 6:21 PM Dakota Billsyan Levy ZOX:[id number]    PATIENT STRESSORS: Financial difficulties Substance abuse   PATIENT STRENGTHS: Ability for insight Capable of independent living Communication skills General fund of knowledge Motivation for treatment/growth   PATIENT IDENTIFIED PROBLEMS:   "I posted a suicide threat on Facebook"     "I was a bullied kid"    "My friends and family told me they don't want to be around me while I'm depressed."           DISCHARGE CRITERIA:  Adequate post-discharge living arrangements Improved stabilization in mood, thinking, and/or behavior Motivation to continue treatment in a less acute level of care Reduction of life-threatening or endangering symptoms to within safe limits Withdrawal symptoms are absent or subacute and managed without 24-hour nursing intervention  PRELIMINARY DISCHARGE PLAN: Outpatient therapy Return to previous living arrangement Return to previous work or school arrangements  PATIENT/FAMILY INVOLVEMENT: This treatment plan has been presented to and reviewed with the patient, Dakota Levy, and/or family member, .  The patient and family have been given the opportunity to ask questions and make suggestions.  Dakota PearsonAmanda N Calven Gilkes, RN 02/06/2016, 6:21 PM

## 2016-02-06 NOTE — Progress Notes (Signed)
Pt admitted voluntarily to [facility] in scrubs. Skin and contraband search completed with two nurses present with no skin issues present and no contraband found. Reports he was brought to hospital by police after posting a suicide threat to Facebook last night. Reports he is unsure who called the police on him. Reports "I had planned to overdose on some sleeping pills, but I didn't. I drank a 12 pack of beer and passed out." Pt reports his actions were "dumb," and that he no longer feels suicidal. Denies HI/AVH. Pt reports "I have no support. My friends and family tell me they don't want to be around me while I'm depressed." Pt then stated that last night he drove his friends home, then himself after drinking. Pt already questioning when he will be discharged "because I have an appointment Thursday to have my car fixed and tomorrow is my birthday." It was reported that pt had a history of self harm by cutting/burning, but pt denies and does not have any skin issues of note. Pt reports he "binge drinks a 12 pack of beer every now and then" and has been for the past few months. Also reports he smoke marijuana and cigarettes, but wants to quit. Reports very good appetite lately, eating more than he usually does, but does report a 7 pound weight loss, weighing 170 pounds last time he weighed, and weighing 163 pounds today.   Oriented to room/unit. Food and fluids provided. Support and encouragement provided with use of therapeutic communication. Safety maintained with every 15 minute checks. Will continue to monitor.

## 2016-02-06 NOTE — Progress Notes (Signed)
02/06/16 1347: LRT went by pt room to offer activities, pt declined.   Caroll RancherMarjette Lillyan Hitson, LRT/CTRS

## 2016-02-07 DIAGNOSIS — F332 Major depressive disorder, recurrent severe without psychotic features: Principal | ICD-10-CM

## 2016-02-07 DIAGNOSIS — T50901A Poisoning by unspecified drugs, medicaments and biological substances, accidental (unintentional), initial encounter: Secondary | ICD-10-CM | POA: Diagnosis present

## 2016-02-07 MED ORDER — TRAZODONE HCL 50 MG PO TABS: 50.0000 mg | ORAL_TABLET | Freq: Every evening | ORAL | 1 refills | Status: DC | PRN

## 2016-02-07 MED ORDER — SERTRALINE HCL 50 MG PO TABS: 50.0000 mg | ORAL_TABLET | Freq: Every day | ORAL | 1 refills | Status: DC

## 2016-02-07 MED ORDER — TRAZODONE HCL 50 MG PO TABS: 50.0000 mg | ORAL_TABLET | Freq: Every evening | ORAL | 1 refills | Status: AC | PRN

## 2016-02-07 MED ORDER — SERTRALINE HCL 50 MG PO TABS: 50.0000 mg | ORAL_TABLET | Freq: Every day | ORAL | Status: DC

## 2016-02-07 MED ORDER — SERTRALINE HCL 50 MG PO TABS: 50.0000 mg | ORAL_TABLET | Freq: Every day | ORAL | 1 refills | Status: AC

## 2016-02-07 NOTE — Discharge Summary (Signed)
Physician Discharge Summary Note  Patient:  Dakota Levy is an 25 y.o., male MRN:  [id number] DOB:  04/20/1990 Patient phone:  [phone] (home)  Patient address:   [address] [place] [address],  Total Time spent with patient: 1 hour  Date of Admission:  02/06/2016 Date of Discharge: 02/07/2016  Reason for Admission:  Suicide attempt.  Identifying data. Dakota Levy is a 25 year old male with a history of anxiety and alcoholism.  Chief complaint. "I was drunk."  History of Present Illness: patient is a 25 year old male who was brought into the ER by police on 02/05/16. Patient posted a video on Facebook stating that he was going to kill himself. The cops came to his residence and gave him the option to come into the hospital or stay at home. Patient chose treatment.  Earlier in the night on 02/05/16 patient was drinking with friends. He approximates that he drank 18 beers. Patient got into an argument with his best friend. Patient became very upset and went home and decided to take sleeping pills. He created a video saying he was going to commit suicide and posted it on Facebook. He admits today that the video was made because he wanted the attention and he wanted someone to find him. Patient took a "handful" of sleeping pills with the intent of hurting himself. Patient is unable to recall the type of sleeping medication he took. He woke to the sound of police at his door.  Patient states that he decided to put his career on hold about 3 months ago so that he could start partying with his friends. He states that most nights he would go out to clubs or bars with his friends and drink. He feels like the alcohol consumption has caused him to become depressed. He states that his friends have noticed him being "down" and have suggested that they do not want to hang out with him if he keeps acting this way. He has decided to stop drinking since he has attributed that to worsening  depression and anxiety.  He denies current thoughts of suicide. He denies homicidality. He denies decrease in energy, sleep, appetite, or mood currently. He denies feelings of worthlessness or guilt. Patient denies withdrawal symptoms. Patient states that he realizes what a terrible mistake he has made and has decided to stop drinking. Patient states in general he feels like his anxiety lead to his feeling of depression. He says he often feels nervous in crowds, excessively worries, and has social anxiety. Patient denies OCD symptoms.  Past psychiatric history. Patient denies any previous attempts at suicide. He states that he has had passive thoughts in the past but they usually come after an argument with a loved one. Patient denies past hospitalizations for mental illness. He states that he has seen a therapist as a child for ADHD. Patient is interested in seeing a therapist regularly outpatient.  Patient denies access to firearms. Denies past trauma.  Family Psychiatric  History: Patient states that his grandmother has been diagnosed with bipolar disorder. He states that his mom has anxiety and OCD but has not been clinically diagnosed.  Social history. Patient states that he is currently living at home with his mom, stepfather, and two younger siblings. He has a good relationship with his mom and younger siblings. Patient states that his relationship with his stepfather, who has been in his life since pt was 25 years old, is a little strained. 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 He completed some college before dropping out.  Principal Problem: MDD (major depressive disorder), recurrent severe, without psychosis [facility](HCC) Discharge Diagnoses: Patient Active Problem List   Diagnosis Date Noted  . Overdose [T50.901A] 02/07/2016  . MDD (major depressive disorder), recurrent severe, without psychosis (HCC) [F33.2] 02/06/2016  . Substance induced  mood disorder (HCC) [F19.94] 02/06/2016  . Alcohol use disorder, moderate, dependence (HCC) [F10.20] 02/06/2016  . Cannabis use disorder, moderate, dependence (HCC) [F12.20] 02/06/2016  . Suicidal ideation [R45.851] 02/06/2016  . Tobacco use disorder [F17.200] 02/06/2016   Past Medical History:  Past Medical History:  Diagnosis Date  . IBS (irritable bowel syndrome)     Past Surgical History:  Procedure Laterality Date  . WRIST SURGERY Left    3 times   Family History: History reviewed. No pertinent family history.  Social History:  History  Alcohol Use  . 7.2 oz/week  . 12 Cans of beer per week    Comment: pt says he "binge" drinks for the past few months     History  Drug Use  . Types: Marijuana    Social History   Social History  . Marital status: Single    Spouse name: N/A  . Number of children: N/A  . Years of education: N/A   Social History Main Topics  . Smoking status: Current Every Day Smoker    Packs/day: 0.50    Types: Cigarettes  . Smokeless tobacco: Never Used  . Alcohol use 7.2 oz/week    12 Cans of beer per week     Comment: pt says he "binge" drinks for the past few months  . Drug use:     Types: Marijuana  . Sexual activity: Yes    Birth control/ protection: Condom     Comment: pt had condom with his belongings   Other Topics Concern  . None   Social History Narrative  . None    Hospital Course:    Dakota Levy is a 25 year old male with a history of anxiety and alcoholism admitted after suicide attempt by overdose in the context of relationship problems.  1. Suicidal ideation. This has resolved. The patient adamantly denies any intentions, thoughts or plans To hurt himself or others. He is able to contract for safety. He is forward thinking and optimistic about the future.  2. Mood. The patient agrees to start Zoloft for anxiety.  3. Alcohol use. The patient has been drinking heavily for the past 3 months. He was offered Librium  taper but he denies any symptoms of alcohol withdrawal. Vital signs are stable.  4. Substance abuse treatment. If the patient declines substance abuse treatment but his intention is to stop drinking. He understands the effects of alcohol alcohol on mood.  5. Smoking. Nicotine patch is available.  6. Insomnia. Trazodone was available.  7. Disposition. The patient was discharged to home with his family. He will follow up with [facility].   Physical Findings: AIMS: Facial and Oral Movements Muscles of Facial Expression: None, normal Lips and Perioral Area: None, normal Jaw: None, normal Tongue: None, normal,Extremity Movements Upper (arms, wrists, hands, fingers): None, normal Lower (legs, knees, ankles, toes): None, normal, Trunk Movements Neck, shoulders, hips: None, normal, Overall Severity Severity of abnormal movements (highest score from questions above): None, normal Incapacitation due to abnormal movements: None, normal Patient's awareness of abnormal movements (rate only patient's report): No Awareness, Dental Status Current problems with teeth and/or dentures?: Yes (  poor hygiene) Does patient usually wear dentures?: No  CIWA:  CIWA-Ar Total: 0 COWS:     Musculoskeletal: Strength & Muscle Tone: within normal limits Gait & Station: normal Patient leans: N/A  Psychiatric Specialty Exam: Physical Exam  Nursing note and vitals reviewed.   Review of Systems  Psychiatric/Behavioral: Positive for substance abuse. The patient is nervous/anxious.   All other systems reviewed and are negative.   Blood pressure 126/81, pulse 81, temperature 98.4 F (36.9 C), temperature source Oral, resp. rate 20, height 6\' 3"  (1.905 m), weight 73.9 kg (163 lb), SpO2 100 %.Body mass index is 20.37 kg/m.  General Appearance: Casual  Eye Contact:  Good  Speech:  Clear and Coherent  Volume:  Normal  Mood:  Anxious  Affect:  Appropriate  Thought Process:  Goal Directed and Descriptions of  Associations: Intact  Orientation:  Full (Time, Place, and Person)  Thought Content:  WDL  Suicidal Thoughts:  No  Homicidal Thoughts:  No  Memory:  Immediate;   Fair Recent;   Fair Remote;   Fair  Judgement:  Impaired  Insight:  Shallow  Psychomotor Activity:  Normal  Concentration:  Concentration: Fair and Attention Span: Fair  Recall:  [employer] of Knowledge:  Fair  Language:  Fair  Akathisia:  No  Handed:  Right  AIMS (if indicated):     Assets:  Communication Skills Desire for Improvement Financial Resources/Insurance Housing Physical Health Resilience Social Support Talents/Skills Transportation Vocational/Educational  ADL's:  Intact  Cognition:  WNL  Sleep:  Number of Hours: 6.75     Have you used any form of tobacco in the last 30 days? (Cigarettes, Smokeless Tobacco, Cigars, and/or Pipes): Yes  Has this patient used any form of tobacco in the last 30 days? (Cigarettes, Smokeless Tobacco, Cigars, and/or Pipes) Yes, Yes, A prescription for an FDA-approved tobacco cessation medication was offered at discharge and the patient refused  Blood Alcohol level:  Lab Results  Component Value Date   ETH 16 (H) 02/06/2016    Metabolic Disorder Labs:  No results found for: HGBA1C, MPG No results found for: PROLACTIN No results found for: CHOL, TRIG, HDL, CHOLHDL, VLDL, LDLCALC  See Psychiatric Specialty Exam and Suicide Risk Assessment completed by Attending Physician prior to discharge.  Discharge destination:  Home  Is patient on multiple antipsychotic therapies at discharge:  No   Has Patient had three or more failed trials of antipsychotic monotherapy by history:  No  Recommended Plan for Multiple Antipsychotic Therapies: NA  Discharge Instructions    Diet - low sodium heart healthy    Complete by:  As directed    Increase activity slowly    Complete by:  As directed        Medication List    TAKE these medications     Indication  ibuprofen 200 MG  tablet Commonly known as:  ADVIL,MOTRIN Take 800 mg by mouth every 6 (six) hours as needed for pain.  Indication:  Inflammation   sertraline 50 MG tablet Commonly known as:  ZOLOFT Take 1 tablet (50 mg total) by mouth at bedtime.  Indication:  Major Depressive Disorder   traZODone 50 MG tablet Commonly known as:  DESYREL Take 1 tablet (50 mg total) by mouth at bedtime as needed for sleep.  Indication:  Trouble Sleeping        Follow-up recommendations:  Activity:  As tolerated. Diet:  Regular. Tests:  Keep follow-up appointments.  Comments:    Signed: Aveen Stansel,  MD 02/07/2016, 12:23 PM

## 2016-02-07 NOTE — Progress Notes (Signed)
D: Patient appears anxious. States he didn't know he was going to be locked up here and just wanted to get up with his mental health. Asking for d/c. Denies SI/HI/AVH. Visible in the milieu interacting with peers. Requested medication for sleep.  A: Medication given with education. Encouragement provided.  R: Patient was compliant with medication. He has remained calm and cooperative. Safety maintained with 15 min checks.

## 2016-02-07 NOTE — Plan of Care (Signed)
Problem: Safety: Goal: Ability to remain free from injury will improve Outcome: Progressing Patient has remained free of injury during this shift.    

## 2016-02-07 NOTE — Tx Team (Signed)
Pt was admitted after the previous tx team mtg that the CSW was present for (on 02/06/16) and before the next scheduled tx meeting (on 02/08/16) and as such, a tx team note was not needed.  Pt also was admitted and discharged within a 24 hour period and as such, a PSA is not needed.  Caretha Rumbaugh F. Thomas Rhude, LCSWA, LCAS  02/07/16  

## 2016-02-07 NOTE — [facility] Group Notes (Signed)
[facility] LCSW Group Therapy  02/07/2016 11:40 AM  Type of Therapy:  Group Therapy  Participation Level:  Active  Participation Quality:  Appropriate  Affect:  Anxious  Cognitive:  Appropriate  Insight:  Improving  Engagement in Therapy:  Improving  Modes of Intervention:  Activity, Discussion, Education and Support  Summary of Progress/Problems:Balance in life: Patients will discuss the concept of balance and how it looks and feels to be unbalanced. Pt will identify areas in their life that is unbalanced and ways to become more balanced. Patient discussed regretting his discussion on attempting suicide. Patient states he never wants to drink alcohol again and feels he will be able to remain sober when discharged. CSW discussed with patient about having an outpatient therapist and psychiatrist as well as additional community supports.   Dakota Levy G. Garnette CzechSampson MSW, LCSWA 02/07/2016, 11:44 AM

## 2016-02-07 NOTE — [facility] Suicide Risk Assessment (Addendum)
[facility] INPATIENT:  Family/Significant Other Suicide Prevention Education  Suicide Prevention Education:  Education Completed; with the pt's mother at ph: [phone],  (name of family member/significant other) has been identified by the patient as the family member/significant other with whom the patient will be residing, and identified as the person(s) who will aid the patient in the event of a mental health crisis (suicidal ideations/suicide attempt).  With written consent from the patient, the family member/significant other has been provided the following suicide prevention education, prior to the and/or following the discharge of the patient.  CSW completed SPE with the pt.    The suicide prevention education provided includes the following:  Suicide risk factors  Suicide prevention and interventions  National Suicide Hotline telephone number  [facility] assessment telephone number  [facility] Emergency Assistance 911  [facility] and/or Residential Mobile Crisis Unit telephone number  Request made of family/significant other to:  Remove weapons (e.g., guns, rifles, knives), all items previously/currently identified as safety concern.    Remove drugs/medications (over-the-counter, prescriptions, illicit drugs), all items previously/currently identified as a safety concern.  The family member/significant other verbalizes understanding of the suicide prevention education information provided.  The family member/significant other agrees to remove the items of safety concern listed above.  Dorothe PeaJonathan F Daliya Parchment 02/07/2016, 1:38 PM

## 2016-02-07 NOTE — [facility] Suicide Risk Assessment (Signed)
[facility]   Principal Problem: MDD (major depressive disorder), recurrent severe, without psychosis (HCC) Discharge Diagnoses:  Patient Active Problem List   Diagnosis Date Noted  . Overdose [T50.901A] 02/07/2016  . MDD (major depressive disorder), recurrent severe, without psychosis (HCC) [F33.2] 02/06/2016  . Substance induced mood disorder (HCC) [F19.94] 02/06/2016  . Alcohol use disorder, moderate, dependence (HCC) [F10.20] 02/06/2016  . Cannabis use disorder, moderate, dependence (HCC) [F12.20] 02/06/2016  . Suicidal ideation [R45.851] 02/06/2016  . Tobacco use disorder [F17.200] 02/06/2016    Total Time spent with patient: 1 hour  Musculoskeletal: Strength & Muscle Tone: within normal limits Gait & Station: normal Patient leans: N/A  Psychiatric Specialty Exam: Review of Systems  Psychiatric/Behavioral: Positive for substance abuse. The patient is nervous/anxious.   All other systems reviewed and are negative.   Blood pressure 126/81, pulse 81, temperature 98.4 F (36.9 C), temperature source Oral, resp. rate 20, height 6\' 3"  (1.905 m), weight 73.9 kg (163 lb), SpO2 100 %.Body mass index is 20.37 kg/m.  See SRA.                                                     Mental Status Per Nursing Assessment::   On Admission:     Demographic Factors:  Male, Adolescent or young adult, Caucasian and Gay, lesbian, or bisexual orientation  Loss Factors: Loss of significant relationship and NA  Historical Factors: Impulsivity  Risk Reduction Factors:   Sense of responsibility to family, Employed, Living with another person, especially a relative and Positive social support  Continued Clinical Symptoms:  Depression:   Comorbid alcohol abuse/dependence Impulsivity Alcohol/Substance Abuse/Dependencies  Cognitive Features That Contribute To Risk:  None    Suicide Risk:  Minimal: No identifiable suicidal ideation.   Patients presenting with no risk factors but with morbid ruminations; may be classified as minimal risk based on the severity of the depressive symptoms    Plan Of Care/Follow-up recommendations:  Activity:  As tolerated. Diet:  Regular. Other:  Keep follow-up appointments.  Kristine LineaJolanta Charnetta Wulff, MD 02/07/2016, 12:20 PM

## 2016-02-07 NOTE — Progress Notes (Addendum)
  [facility] :  Will you be returning to the same living situation after discharge:  Yes,  pt will be returning home to [place] to live with his mother At discharge, do you have transportation home?: Yes,  pt will be picked up by his mother Do you have the ability to pay for your medications: Yes,  pt will be provided with prescriptions at discharge  Release of information consent forms completed and in the chart;  Patient's signature needed at discharge.  Patient to Follow up at: Follow-up Information    [facility] .   Specialty:  Behavioral Health Why:  Please arrive to the walk-in clinic Monday through Friday from 9-4pm for your assessment for your hospital follow up for medication management, substance abuse treatment and therapy. Contact information: [address] [place] [address] [phone]        [facility] .   Why:  Please arrive for your appointment on Monday [facility] on monday November 6th, 2017 for substance abuse treatment and therapy.  Please call for asisstance and to confirm your appointment. Please bring your dicharge paperwork to see Jaquita FoldsCindy Ziller. Contact information: [facility] [address] [place], [place] [address] Ph: [phone] Fax: [phone] Fax number not provided by provider.          Next level of care provider has access to [facility] Link:yes  Safety Planning and Suicide Prevention discussed: Yes,  completed with pt  Have you used any form of tobacco in the last 30 days? (Cigarettes, Smokeless Tobacco, Cigars, and/or Pipes): Yes  Has patient been referred to the Quitline?: Yes, faxed on 02/07/16  Patient has been referred for addiction treatment: Yes  Dorothe PeaJonathan F Najat Olazabal 02/07/2016, 1:59 PM

## 2016-02-07 NOTE — H&P (Signed)
Psychiatric Admission Assessment Adult  Patient Identification: Dakota Levy MRN:  [id number] Date of Evaluation:  02/07/2016 Chief Complaint:  Depression Principal Diagnosis: MDD (major depressive disorder), recurrent severe, without psychosis ([place]) Diagnosis:   Patient Active Problem List   Diagnosis Date Noted  . Overdose [T50.901A] 02/07/2016  . MDD (major depressive disorder), recurrent severe, without psychosis ([place]) [F33.2] 02/06/2016  . Substance induced mood disorder ([place]) [F19.94] 02/06/2016  . Alcohol use disorder, moderate, dependence ([place]) [F10.20] 02/06/2016  . Cannabis use disorder, moderate, dependence ([place]) [F12.20] 02/06/2016  . Suicidal ideation [R45.851] 02/06/2016  . Tobacco use disorder [F17.200] 02/06/2016   Identifying data. Mr. Lyford is a 25 year old male with a history of anxiety and alcoholism.  Chief complaint. "I was drunk."  History of Present Illness: patient is a 25 year old male who was brought into the ER by police on 19/14/78. Patient posted a video on Facebook stating that he was going to kill himself. The cops came to his residence and gave him the option to come into the hospital or stay at home. Patient chose treatment.  Earlier in the night on 02/05/16 patient was drinking with friends. He approximates that he drank 18 beers. Patient got into an argument with his best friend. Patient became very upset and went home and decided to take sleeping pills. He created a video saying he was going to commit suicide and posted it on Facebook. He admits today that the video was made because he wanted the attention and he wanted someone to find him. Patient took a "handful" of sleeping pills with the intent of hurting himself. Patient is unable to recall the type of sleeping medication he took. He woke to the sound of police at his door.  Patient states that he decided to put his career on hold about 3 months ago so that he could start partying with his  friends. He states that most nights he would go out to clubs or bars with his friends and drink. He feels like the alcohol consumption has caused him to become depressed. He states that his friends have noticed him being "down" and have suggested that they do not want to hang out with him if he keeps acting this way. He has decided to stop drinking since he has attributed that to worsening depression and anxiety.  He denies current thoughts of suicide. He denies homicidality. He denies decrease in energy, sleep, appetite, or mood currently. He denies feelings of worthlessness or guilt. Patient denies withdrawal symptoms. Patient states that he realizes what a terrible mistake he has made and has decided to stop drinking. Patient states in general he feels like his anxiety lead to his feeling of depression. He says he often feels nervous in crowds, excessively worries, and has social anxiety. Patient denies OCD symptoms.  Past psychiatric history. Patient denies any previous attempts at suicide. He states that he has had passive thoughts in the past but they usually come after an argument with a loved one. Patient denies past hospitalizations for mental illness. He states that he has seen a therapist as a child for ADHD. Patient is interested in seeing a therapist regularly outpatient.  Patient denies access to firearms. Denies past trauma.  Family Psychiatric  History: Patient states that his grandmother has been diagnosed with bipolar disorder. He states that his mom has anxiety and OCD but has not been clinically diagnosed.  Social history. Patient states that he is currently living at home with his mom, stepfather, and  two younger siblings. He has a good relationship with his mom and younger siblings. Patient states that his relationship with his stepfather, who has been in his life since pt was 25 years old, is a little strained. 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 He completed some college before dropping out.  Associated Signs/Symptoms: Depression Symptoms:  Feelings of depression have resolved (Hypo) Manic Symptoms:  None Anxiety Symptoms:  Excessive Worry, Social Anxiety, Psychotic Symptoms:  none PTSD Symptoms: NA  Total Time spent with patient: 1 hour  Is the patient at risk to self? No.  Has the patient been a risk to self in the past 6 months? Yes.    Has the patient been a risk to self within the distant past? No.  Is the patient a risk to others? No.  Has the patient been a risk to others in the past 6 months? No.  Has the patient been a risk to others within the distant past? No.   Prior Inpatient Therapy:   Prior Outpatient Therapy:    Alcohol Screening: 1. How often do you have a drink containing alcohol?: 2 to 3 times a week 2. How many drinks containing alcohol do you have on a typical day when you are drinking?: 10 or more 3. How often do you have six or more drinks on one occasion?: Weekly Preliminary Score: 7 4. How often during the last year have you found that you were not able to stop drinking once you had started?: Less than monthly 5. How often during the last year have you failed to do what was normally expected from you becasue of drinking?: Less than monthly 6. How often during the last year have you needed a first drink in the morning to get yourself going after a heavy drinking session?: Never 7. How often during the last year have you had a feeling of guilt of remorse after drinking?: Weekly 8. How often during the last year have you been unable to remember what happened the night before because you had been drinking?: Less than monthly 9. Have you or someone else been injured as a result of your drinking?: No 10. Has a relative or friend or a doctor or another health worker been concerned about your drinking or suggested you cut down?: Yes, during the last year Alcohol Use Disorder Identification Test  Final Score (AUDIT): 20 Brief Intervention: Yes   Substance Abuse History in the last 12 months:  Yes.    Patiet smokes marijuana regularly.  Consequences of Substance Abuse: Family Consequences:  Strain   Previous Psychotropic Medications: Yes   Psychological Evaluations: Yes   Past Medical History:  Past Medical History:  Diagnosis Date  . IBS (irritable bowel syndrome)     Past Surgical History:  Procedure Laterality Date  . WRIST SURGERY Left    3 times   Family History: History reviewed. No pertinent family history.   Tobacco Screening: Have you used any form of tobacco in the last 30 days? (Cigarettes, Smokeless Tobacco, Cigars, and/or Pipes): Yes Tobacco use, Select all that apply: 5 or more cigarettes per day Are you interested in Tobacco Cessation Medications?: Yes, will notify MD for an order Counseled patient on smoking cessation including recognizing danger situations, developing coping skills and basic information about quitting provided: Yes   Social History:  History  Alcohol Use  . 7.2 oz/week  . 12 Cans of beer per week  Comment: pt says he "binge" drinks for the past few months     History  Drug Use  . Types: Marijuana    Additional Social History:      History of alcohol / drug use?: Yes Negative Consequences of Use: Personal relationships, Financial      Allergies:   Allergies  Allergen Reactions  . Ketamine Hcl Other (See Comments)    Hallucinations.   Lab Results:  Results for orders placed or performed during the hospital encounter of 02/06/16 (from the past 48 hour(s))  Comprehensive metabolic panel     Status: Abnormal   Collection Time: 02/06/16 10:31 AM  Result Value Ref Range   Sodium 142 135 - 145 mmol/L   Potassium 3.2 (L) 3.5 - 5.1 mmol/L   Chloride 108 101 - 111 mmol/L   CO2 25 22 - 32 mmol/L   Glucose, Bld 87 65 - 99 mg/dL   BUN 11 6 - 20 mg/dL   Creatinine, Ser 0.85 0.61 - 1.24 mg/dL   Calcium 9.3 8.9 - 10.3 mg/dL    Total Protein 8.0 6.5 - 8.1 g/dL   Albumin 4.9 3.5 - 5.0 g/dL   AST 20 15 - 41 U/L   ALT 17 17 - 63 U/L   Alkaline Phosphatase 47 38 - 126 U/L   Total Bilirubin 0.6 0.3 - 1.2 mg/dL   GFR calc non Af Amer >60 >60 mL/min   GFR calc Af Amer >60 >60 mL/min    Comment: (NOTE) The eGFR has been calculated using the CKD EPI equation. This calculation has not been validated in all clinical situations. eGFR's persistently <60 mL/min signify possible Chronic Kidney Disease.    Anion gap 9 5 - 15  Ethanol     Status: Abnormal   Collection Time: 02/06/16 10:31 AM  Result Value Ref Range   Alcohol, Ethyl (B) 16 (H) <5 mg/dL    Comment:        LOWEST DETECTABLE LIMIT FOR SERUM ALCOHOL IS 5 mg/dL FOR MEDICAL PURPOSES ONLY   Salicylate level     Status: None   Collection Time: 02/06/16 10:31 AM  Result Value Ref Range   Salicylate Lvl <2.2 2.8 - 30.0 mg/dL  Acetaminophen level     Status: Abnormal   Collection Time: 02/06/16 10:31 AM  Result Value Ref Range   Acetaminophen (Tylenol), Serum <10 (L) 10 - 30 ug/mL    Comment:        THERAPEUTIC CONCENTRATIONS VARY SIGNIFICANTLY. A RANGE OF 10-30 ug/mL MAY BE AN EFFECTIVE CONCENTRATION FOR MANY PATIENTS. HOWEVER, SOME ARE BEST TREATED AT CONCENTRATIONS OUTSIDE THIS RANGE. ACETAMINOPHEN CONCENTRATIONS >150 ug/mL AT 4 HOURS AFTER INGESTION AND >50 ug/mL AT 12 HOURS AFTER INGESTION ARE OFTEN ASSOCIATED WITH TOXIC REACTIONS.   cbc     Status: Abnormal   Collection Time: 02/06/16 10:31 AM  Result Value Ref Range   WBC 7.5 4.0 - 10.5 K/uL   RBC 5.19 4.22 - 5.81 MIL/uL   Hemoglobin 17.0 13.0 - 17.0 g/dL   HCT 46.7 39.0 - 52.0 %   MCV 90.0 78.0 - 100.0 fL   MCH 32.8 26.0 - 34.0 pg   MCHC 36.4 (H) 30.0 - 36.0 g/dL   RDW 13.0 11.5 - 15.5 %   Platelets 212 150 - 400 K/uL  Rapid urine drug screen (hospital performed)     Status: Abnormal   Collection Time: 02/06/16 10:31 AM  Result Value Ref Range   Opiates NONE DETECTED NONE  DETECTED  Cocaine NONE DETECTED NONE DETECTED   Benzodiazepines NONE DETECTED NONE DETECTED   Amphetamines NONE DETECTED NONE DETECTED   Tetrahydrocannabinol POSITIVE (A) NONE DETECTED   Barbiturates NONE DETECTED NONE DETECTED    Comment:        DRUG SCREEN FOR MEDICAL PURPOSES ONLY.  IF CONFIRMATION IS NEEDED FOR ANY PURPOSE, NOTIFY LAB WITHIN 5 DAYS.        LOWEST DETECTABLE LIMITS FOR URINE DRUG SCREEN Drug Class       Cutoff (ng/mL) Amphetamine      1000 Barbiturate      200 Benzodiazepine   283 Tricyclics       662 Opiates          300 Cocaine          300 THC              50     Blood Alcohol level:  Lab Results  Component Value Date   ETH 16 (H) 94/76/5465    Metabolic Disorder Labs:  No results found for: HGBA1C, MPG No results found for: PROLACTIN No results found for: CHOL, TRIG, HDL, CHOLHDL, VLDL, LDLCALC  Current Medications: Current Facility-Administered Medications  Medication Dose Route Frequency Provider Last Rate Last Dose  . acetaminophen (TYLENOL) tablet 650 mg  650 mg Oral Q6H PRN Hildred Priest, MD      . alum & mag hydroxide-simeth (MAALOX/MYLANTA) 200-200-20 MG/5ML suspension 30 mL  30 mL Oral Q4H PRN Hildred Priest, MD      . ibuprofen (ADVIL,MOTRIN) tablet 800 mg  800 mg Oral Q6H PRN Hildred Priest, MD      . Influenza vac split quadrivalent PF (FLUARIX) injection 0.5 mL  0.5 mL Intramuscular Tomorrow-1000 Latayvia Mandujano B Hadrian Yarbrough, MD      . magnesium hydroxide (MILK OF MAGNESIA) suspension 30 mL  30 mL Oral Daily PRN Hildred Priest, MD      . nicotine (NICODERM CQ - dosed in mg/24 hours) patch 21 mg  21 mg Transdermal Q0600 Hildred Priest, MD      . pneumococcal 23 valent vaccine (PNU-IMMUNE) injection 0.5 mL  0.5 mL Intramuscular Tomorrow-1000 Jarrell Armond B Ames Hoban, MD      . sertraline (ZOLOFT) tablet 50 mg  50 mg Oral QHS Reathel Turi B Allahna Husband, MD      . traZODone (DESYREL) tablet 50 mg  50  mg Oral QHS PRN Hildred Priest, MD   50 mg at 02/06/16 2132   PTA Medications: Prescriptions Prior to Admission  Medication Sig Dispense Refill Last Dose  . ibuprofen (ADVIL,MOTRIN) 200 MG tablet Take 800 mg by mouth every 6 (six) hours as needed for pain.   Past Month at Unknown time    Musculoskeletal: Strength & Muscle Tone: within normal limits Gait & Station: normal Patient leans: N/A  Psychiatric Specialty Exam: I reviewed physical exam performed in the emergency room and agree with her findings. Physical Exam  Nursing note and vitals reviewed.   Review of Systems  Psychiatric/Behavioral: Positive for substance abuse. The patient is nervous/anxious.     Blood pressure 126/81, pulse 81, temperature 98.4 F (36.9 C), temperature source Oral, resp. rate 20, height '6\' 3"'  (1.905 m), weight 73.9 kg (163 lb), SpO2 100 %.Body mass index is 20.37 kg/m.  General Appearance: Fairly Groomed  Eye Contact:  Good  Speech:  Clear and Coherent and Normal Rate  Volume:  Normal  Mood:  Appropriate  Affect:  Appropriate  Thought Process:  Coherent  Orientation:  Full (Time, Place, and  Person)  Thought Content:  WDL  Suicidal Thoughts:  No  Homicidal Thoughts:  No  Memory:  NA  Judgement:  Fair  Insight:  Good  Psychomotor Activity:  Normal    Recall:  Good  Fund of Knowledge:  Good  Language:  Good  Akathisia:  No    AIMS (if indicated):     Assets:  Communication Skills Housing Social Support  ADL's:  Intact  Cognition:  WNL  Sleep:  Number of Hours: 6.75    Treatment Plan Summary: Daily contact with patient to assess and evaluate symptoms and progress in treatment  Mr. Bannan is a 25 year old male with a history of anxiety and alcoholism admitted after suicide attempt by overdose in the context of relationship problems.  1. Suicidal ideation. This has resolved. The patient adamantly denies any intentions, thoughts or plans To hurt himself or others. He is  able to contract for safety. He is forward thinking and optimistic about the future.  2. Mood. The patient agrees to start Zoloft for anxiety.  3. Alcohol use. The patient has been drinking heavily for the past 3 months. He was offered Librium taper but he denies any symptoms of alcohol withdrawal. Vital signs are stable.  4. Substance abuse treatment. If the patient declines substance abuse treatment but his intention is to stop drinking. He understands the effects of alcohol alcohol on mood.  5. Smoking. Nicotine patch is available.  6. Insomnia. Trazodone was available.  7. Disposition. The patient will be discharged to home with his family. He will follow up with [facility].   Physician Treatment Plan for Primary Diagnosis: MDD (major depressive disorder), recurrent severe, without psychosis ([place]) Long Term Goal(s): Improvement in symptoms so as ready for discharge  Short Term Goals: Ability to identify changes in lifestyle to reduce recurrence of condition will improve, Ability to verbalize feelings will improve, Ability to disclose and discuss suicidal ideas, Ability to demonstrate self-control will improve, Ability to identify and develop effective coping behaviors will improve, Ability to maintain clinical measurements within normal limits will improve and Compliance with prescribed medications will improve  Physician Treatment Plan for Secondary Diagnosis: Principal Problem:   MDD (major depressive disorder), recurrent severe, without psychosis ([place]) Active Problems:   Substance induced mood disorder ([place])   Alcohol use disorder, moderate, dependence ([place])   Cannabis use disorder, moderate, dependence ([place])   Suicidal ideation   Tobacco use disorder   Overdose  Long Term Goal(s): Improvement in symptoms so as ready for discharge  Short Term Goals: Ability to identify changes in lifestyle to reduce recurrence of condition will improve, Ability to demonstrate self-control will  improve and Ability to identify triggers associated with substance abuse/mental health issues will improve  I certify that inpatient services furnished can reasonably be expected to improve the patient's condition.    Orson Slick, MD 11/1/201712:38 PM

## 2016-02-07 NOTE — [facility] Suicide Risk Assessment (Signed)
[facility]   Nursing information obtained from:    Demographic factors:    Current Mental Status:    Loss Factors:    Historical Factors:    Risk Reduction Factors:     Total Time spent with patient: 1 hour Principal Problem: MDD (major depressive disorder), recurrent severe, without psychosis ([facility]) Diagnosis:   Patient Active Problem List   Diagnosis Date Noted  . Overdose [T50.901A] 02/07/2016  . MDD (major depressive disorder), recurrent severe, without psychosis ([facility]) [F33.2] 02/06/2016  . Substance induced mood disorder ([facility]) [F19.94] 02/06/2016  . Alcohol use disorder, moderate, dependence ([facility]) [F10.20] 02/06/2016  . Cannabis use disorder, moderate, dependence ([facility]) [F12.20] 02/06/2016  . Suicidal ideation [R45.851] 02/06/2016  . Tobacco use disorder [F17.200] 02/06/2016   Subjective Data: suicide attempt  Continued Clinical Symptoms:  Alcohol Use Disorder Identification Test Final Score (AUDIT): 20 The "Alcohol Use Disorders Identification Test", Guidelines for Use in Primary Care, Second Edition.  World Science writerHealth Organization [facility](WHO). Score between 0-7:  no or low risk or alcohol related problems. Score between 8-15:  moderate risk of alcohol related problems. Score between 16-19:  high risk of alcohol related problems. Score 20 or above:  warrants further diagnostic evaluation for alcohol dependence and treatment.   CLINICAL FACTORS:   Severe Anxiety and/or Agitation Depression:   Comorbid alcohol abuse/dependence Impulsivity Alcohol/Substance Abuse/Dependencies   Musculoskeletal: Strength & Muscle Tone: within normal limits Gait & Station: normal Patient leans: N/A  Psychiatric Specialty Exam: Physical Exam  Nursing note and vitals reviewed.   Review of Systems  Psychiatric/Behavioral: The patient is nervous/anxious.   All other systems reviewed and are negative.   Blood pressure 126/81, pulse 81, temperature 98.4 F (36.9 C),  temperature source Oral, resp. rate 20, height 6\' 3"  (1.905 m), weight 73.9 kg (163 lb), SpO2 100 %.Body mass index is 20.37 kg/m.  General Appearance: Casual  Eye Contact:  Good  Speech:  Clear and Coherent  Volume:  Normal  Mood:  Anxious  Affect:  Appropriate  Thought Process:  Goal Directed and Descriptions of Associations: Intact  Orientation:  Full (Time, Place, and Person)  Thought Content:  WDL  Suicidal Thoughts:  No  Homicidal Thoughts:  No  Memory:  Immediate;   Fair Recent;   Fair Remote;   Fair  Judgement:  Impaired  Insight:  Shallow  Psychomotor Activity:  Normal  Concentration:  Concentration: Fair and Attention Span: Fair  Recall:  [employer]  Fund of Knowledge:  Fair  Language:  Fair  Akathisia:  No  Handed:  Right  AIMS (if indicated):     Assets:  Communication Skills Desire for Improvement Financial Resources/Insurance Housing Physical Health Resilience Social Support Transportation Vocational/Educational  ADL's:  Intact  Cognition:  WNL  Sleep:  Number of Hours: 6.75      COGNITIVE FEATURES THAT CONTRIBUTE TO RISK:  None    SUICIDE RISK:   Minimal: No identifiable suicidal ideation.  Patients presenting with no risk factors but with morbid ruminations; may be classified as minimal risk based on the severity of the depressive symptoms   PLAN OF CARE: Hospital admission, medication management, substance abuse counseling, discharge planning.  Dakota Levy is a 25 year old male with a history of anxiety and alcoholism admitted after suicide attempt by overdose in the context of relationship problems.  1. Suicidal ideation. This has resolved. The patient adamantly denies any intentions, thoughts or plans To hurt himself or others. He is able to contract for safety.  He is forward thinking and optimistic about the future.  2. Mood. The patient agrees to start Zoloft for anxiety.  3. Alcohol use. The patient has been drinking heavily for the past 3  months. He was offered Librium taper but he denies any symptoms of alcohol withdrawal. Vital signs are stable.  4. Substance abuse treatment. If the patient declines substance abuse treatment but his intention is to stop drinking. He understands the effects of alcohol alcohol on mood.  5. Smoking. Nicotine patch is available.  6. Insomnia. Trazodone was available.  7. Disposition. The patient will be discharged to home with his family. He will follow up with [facility].  I certify that inpatient services furnished can reasonably be expected to improve the patient's condition.  Kristine LineaJolanta Lynwood Kubisiak, MD 02/07/2016, 12:09 PM

## 2016-02-07 NOTE — [facility] Counselor (Signed)
Pt was admitted after the previous tx team mtg that the CSW was present for (on 02/06/16) and before the next scheduled tx meeting (on 02/08/16) and as such, a tx team note was not needed.  Pt also was admitted and discharged within a 24 hour period and as such, a PSA is not needed.  Dakota PeaJonathan F. Leeana Creer, LCSWA, LCAS  02/07/16

## 2016-02-07 NOTE — Progress Notes (Signed)
Patient discharged home. DC instructions provided and explained. Medications reviewed. Rx given. All questions answered. Pt stable at discharge. Denies SI, HI, AVH. Belongings returned. 

## 2019-08-12 ENCOUNTER — Telehealth: Payer: Self-pay | Admitting: General Practice

## 2019-08-12 NOTE — Telephone Encounter (Signed)
Called patient and left message for patient to call the office back to schedule and establish with a provider.
